# Patient Record
Sex: Male | Born: 1970 | Race: Black or African American | Hispanic: No | Marital: Married | Smoking: Current some day smoker
Health system: Southern US, Community
[De-identification: ages and names within clinical notes are randomized; demographics above are authoritative.]

---

## 1998-12-07 ENCOUNTER — Ambulatory Visit (HOSPITAL_COMMUNITY): Admission: RE | Admit: 1998-12-07 | Discharge: 1998-12-07 | Payer: Self-pay | Admitting: Internal Medicine

## 1998-12-07 ENCOUNTER — Encounter: Payer: Self-pay | Admitting: Internal Medicine

## 2000-10-02 ENCOUNTER — Emergency Department (HOSPITAL_COMMUNITY): Admission: EM | Admit: 2000-10-02 | Discharge: 2000-10-02 | Payer: Self-pay

## 2002-02-07 ENCOUNTER — Emergency Department (HOSPITAL_COMMUNITY): Admission: EM | Admit: 2002-02-07 | Discharge: 2002-02-07 | Payer: Self-pay

## 2002-06-10 ENCOUNTER — Encounter: Payer: Self-pay | Admitting: Emergency Medicine

## 2002-06-10 ENCOUNTER — Emergency Department (HOSPITAL_COMMUNITY): Admission: EM | Admit: 2002-06-10 | Discharge: 2002-06-10 | Payer: Self-pay

## 2002-10-30 ENCOUNTER — Encounter: Payer: Self-pay | Admitting: Orthopedic Surgery

## 2002-10-30 ENCOUNTER — Encounter: Admission: RE | Admit: 2002-10-30 | Discharge: 2002-10-30 | Payer: Self-pay | Admitting: Orthopedic Surgery

## 2002-11-13 ENCOUNTER — Encounter: Admission: RE | Admit: 2002-11-13 | Discharge: 2002-11-13 | Payer: Self-pay | Admitting: Orthopedic Surgery

## 2002-11-13 ENCOUNTER — Encounter: Payer: Self-pay | Admitting: Orthopedic Surgery

## 2003-12-28 ENCOUNTER — Emergency Department (HOSPITAL_COMMUNITY): Admission: EM | Admit: 2003-12-28 | Discharge: 2003-12-28 | Payer: Self-pay | Admitting: Emergency Medicine

## 2004-05-16 ENCOUNTER — Emergency Department (HOSPITAL_COMMUNITY): Admission: EM | Admit: 2004-05-16 | Discharge: 2004-05-16 | Payer: Self-pay | Admitting: Emergency Medicine
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
## 2004-07-19 ENCOUNTER — Encounter: Admission: RE | Admit: 2004-07-19 | Discharge: 2004-07-19 | Payer: Self-pay | Admitting: Neurosurgery

## 2004-08-04 ENCOUNTER — Encounter: Admission: RE | Admit: 2004-08-04 | Discharge: 2004-08-04 | Payer: Self-pay | Admitting: Neurosurgery

## 2004-08-23 ENCOUNTER — Ambulatory Visit (HOSPITAL_COMMUNITY): Admission: RE | Admit: 2004-08-23 | Discharge: 2004-08-25 | Payer: Self-pay | Admitting: Neurosurgery

## 2004-11-07 ENCOUNTER — Encounter: Admission: RE | Admit: 2004-11-07 | Discharge: 2005-02-01 | Payer: Self-pay | Admitting: Neurosurgery

## 2006-09-05 ENCOUNTER — Emergency Department (HOSPITAL_COMMUNITY): Admission: EM | Admit: 2006-09-05 | Discharge: 2006-09-05 | Payer: Self-pay | Admitting: Family Medicine

## 2007-08-09 ENCOUNTER — Ambulatory Visit (HOSPITAL_COMMUNITY): Admission: RE | Admit: 2007-08-09 | Discharge: 2007-08-09 | Payer: Self-pay | Admitting: Neurosurgery

## 2007-10-14 ENCOUNTER — Emergency Department (HOSPITAL_COMMUNITY): Admission: EM | Admit: 2007-10-14 | Discharge: 2007-10-14 | Payer: Self-pay | Admitting: *Deleted

## 2008-04-09 ENCOUNTER — Emergency Department (HOSPITAL_COMMUNITY): Admission: EM | Admit: 2008-04-09 | Discharge: 2008-04-09 | Payer: Self-pay | Admitting: Emergency Medicine

## 2011-05-11 NOTE — Op Note (Signed)
Bill Ball, Bill Ball                                ACCOUNT NO.:  [id number]   MEDICAL RECORD NO.:  [id number]                   PATIENT TYPE:  OIB   LOCATION:  3008                                 FACILITY:  [facility]   PHYSICIAN:  Coletta Memos, M.D.                  DATE OF BIRTH:  24-Jun-1971   DATE OF PROCEDURE:  DATE OF DISCHARGE:                                 OPERATIVE REPORT   DATE OF PROCEDURE:  August 23, 2004.   PREOPERATIVE DIAGNOSES:  1.  Lumbar stenosis, L4-5.  2.  Displaced disk, L4-5.  3.  Bilateral lumbar radiculopathy.   POSTOPERATIVE DIAGNOSES:  1.  Lumbar stenosis, L4-5.  2.  Displaced disk, L4-5.  3.  Bilateral lumbar radiculopathy.   PROCEDURE:  1.  Hemilaminectomy of L5.  2.  Bilateral diskectomies, L4-5, with microdissection.   COMPLICATIONS:  None.   SURGEON:  Coletta Memos, MD.   ASSISTANT:  Hewitt Shorts, MD.   INDICATIONS:  [name] is a 40 year old gentleman, who had severe stenosis  at L4-5 secondary to a displaced disk and ligamentous hypertrophy.  I  recommended operative decompression after conservative measures had failed.  He agreed and is taken to the operating room today for that.   OPERATIVE NOTE:  Mr. Rasmusson was brought to the operating room, intubated,  placed under general anesthetic, and rolled prone onto a Wilson frame.  All  pressure points were properly padded.  The skin was prepped, he was draped  in a sterile fashion.  I infiltrated 20 mL half-percent Lidocaine with  1:200,000 strength epinephrine.  Using a preoperative localizing film, I  opened the skin with a #10 blade and took that down to the thoracolumbar  fascia.  I then exposed the lamina of what I believed to be L4 and L5  bilaterally.  I took an intraoperative x-ray after placing a double-ended  ganglia knife inferior to the superior lamina in my exposure.  That turned  out to be the L4 lamina.  I then proceeded with hemilaminectomies of L5  bilaterally using a  high-speed drill and Kerrison punches.  I removed the  ligamentum flavum on both sides.  I exposed the disk space on both sides.  I  did most of the diskectomy from the right side using Dr. Earl Gala  assistance, microdissection, Kerrison punches, pituitary rongeurs, and  Epstein curettes.  I also opened the left-sided disk at L4-5 because, after  performing the diskectomy on the right, I still felt that there was a good  deal of pressure from the left side.  After decompressing the disk space, I  inspected the nerve roots.  I removed the ligamentum flavum so that there  was free passage of probes from the right to the left side  underneath the lamina, underneath the spinous process, and supraspinous  ligament.  The supraspinous ligament was kept intact.  I then irrigated the  wound copiously.  I then closed the wound in a layered fashion,  reapproximating the thoracolumbar fascia, subcutaneous tissues, and  subcuticular tissues.  Dermabond used for a sterile dressing.                                               Coletta Memos, M.D.    KC/MEDQ  D:  08/23/2004  T:  08/23/2004  Job:  [id number]

## 2011-05-28 ENCOUNTER — Inpatient Hospital Stay (INDEPENDENT_AMBULATORY_CARE_PROVIDER_SITE_OTHER)
Admission: RE | Admit: 2011-05-28 | Discharge: 2011-05-28 | Disposition: A | Discharge status: Home / Self Care | Payer: 59 | Source: Ambulatory Visit | Attending: Emergency Medicine | Admitting: Emergency Medicine

## 2013-01-05 ENCOUNTER — Other Ambulatory Visit: Payer: Self-pay | Admitting: Occupational Medicine

## 2013-01-05 ENCOUNTER — Ambulatory Visit: Payer: Self-pay

## 2013-01-05 DIAGNOSIS — M549 Dorsalgia, unspecified: Secondary | ICD-10-CM

## 2014-09-14 ENCOUNTER — Other Ambulatory Visit (HOSPITAL_COMMUNITY): Payer: Self-pay | Admitting: Neurosurgery

## 2014-09-14 DIAGNOSIS — R52 Pain, unspecified: Secondary | ICD-10-CM

## 2014-09-23 ENCOUNTER — Ambulatory Visit (HOSPITAL_COMMUNITY)
Admission: RE | Admit: 2014-09-23 | Discharge: 2014-09-23 | Disposition: A | Discharge status: Home / Self Care | Payer: 59 | Source: Ambulatory Visit | Attending: Neurosurgery | Admitting: Neurosurgery

## 2014-09-23 DIAGNOSIS — R52 Pain, unspecified: Secondary | ICD-10-CM

## 2014-09-23 DIAGNOSIS — M79604 Pain in right leg: Secondary | ICD-10-CM | POA: Insufficient documentation

## 2014-09-23 DIAGNOSIS — M545 Low back pain: Secondary | ICD-10-CM | POA: Diagnosis present

## 2014-09-23 MED ORDER — GADOBENATE DIMEGLUMINE 529 MG/ML IV SOLN
20.0000 mL | Freq: Once | INTRAVENOUS | Status: AC | PRN
  Administered 2014-09-23: 20 mL via INTRAVENOUS

## 2015-04-04 ENCOUNTER — Other Ambulatory Visit (HOSPITAL_COMMUNITY): Payer: Self-pay | Admitting: Orthopedic Surgery

## 2015-04-04 DIAGNOSIS — M25511 Pain in right shoulder: Secondary | ICD-10-CM

## 2015-04-22 ENCOUNTER — Ambulatory Visit (HOSPITAL_COMMUNITY)
Admission: RE | Admit: 2015-04-22 | Discharge: 2015-04-22 | Disposition: A | Discharge status: Home / Self Care | Payer: PRIVATE HEALTH INSURANCE | Source: Ambulatory Visit | Attending: Orthopedic Surgery | Admitting: Orthopedic Surgery

## 2015-04-22 DIAGNOSIS — X58XXXA Exposure to other specified factors, initial encounter: Secondary | ICD-10-CM | POA: Diagnosis not present

## 2015-04-22 DIAGNOSIS — M25511 Pain in right shoulder: Secondary | ICD-10-CM

## 2015-04-22 DIAGNOSIS — M19011 Primary osteoarthritis, right shoulder: Secondary | ICD-10-CM | POA: Diagnosis not present

## 2015-04-22 DIAGNOSIS — S46011A Strain of muscle(s) and tendon(s) of the rotator cuff of right shoulder, initial encounter: Secondary | ICD-10-CM | POA: Insufficient documentation

## 2015-05-08 ENCOUNTER — Emergency Department (HOSPITAL_COMMUNITY)
Admission: EM | Admit: 2015-05-08 | Discharge: 2015-05-08 | Disposition: A | Discharge status: Home / Self Care | Payer: 59 | Source: Home / Self Care | Attending: Emergency Medicine | Admitting: Emergency Medicine

## 2015-05-08 ENCOUNTER — Encounter (HOSPITAL_COMMUNITY): Payer: Self-pay | Admitting: Emergency Medicine

## 2015-05-08 DIAGNOSIS — L03114 Cellulitis of left upper limb: Secondary | ICD-10-CM | POA: Diagnosis not present

## 2015-05-08 DIAGNOSIS — W57XXXA Bitten or stung by nonvenomous insect and other nonvenomous arthropods, initial encounter: Secondary | ICD-10-CM | POA: Diagnosis not present

## 2015-05-08 DIAGNOSIS — T148 Other injury of unspecified body region: Secondary | ICD-10-CM

## 2015-05-08 MED ORDER — PENICILLIN G BENZATHINE & PROC 900000-300000 UNIT/2ML IM SUSP
1.2000 10*6.[IU] | Freq: Once | INTRAMUSCULAR | Status: AC
  Administered 2015-05-08: 1.2 10*6.[IU] via INTRAMUSCULAR

## 2015-05-08 MED ORDER — PENICILLIN G BENZATHINE 1200000 UNIT/2ML IM SUSP
INTRAMUSCULAR | Status: AC
Start: 2015-05-08 — End: 2015-05-08
  Filled 2015-05-08: qty 2

## 2015-05-08 MED ORDER — CLINDAMYCIN HCL 300 MG PO CAPS: 300.0000 mg | ORAL_CAPSULE | Freq: Three times a day (TID) | ORAL | Status: AC

## 2015-05-08 NOTE — ED Notes (Signed)
C/o insect bite on left arm which was noticed on Thursday States area does itch, is warm and spreaded States on Wednesday he was nausea and fatigue

## 2015-05-08 NOTE — Discharge Instructions (Signed)
Insect bite looks like it is getting infected. Take clindamycin 3 times a day for the next 10 days. You may have some loose stool on the antibiotic. You should see improvement in the next 2 days. If the redness is spreading very rapidly, please go to the emergency room. If things are not improving in 2 days, please come back.

## 2015-05-08 NOTE — ED Provider Notes (Signed)
CSN: [id number]     Arrival date & time 05/08/15  0904 History   First MD Initiated Contact with Patient 05/08/15 0932     Chief Complaint  Patient presents with  . Insect Bite   (Consider location/radiation/quality/duration/timing/severity/associated sxs/prior Treatment) HPI  He is a 44 year old man here for evaluation of this site. He first noticed the bite on Thursday. It is located on his left upper arm. It initially started out as a small bump, but over the last 2 days has developed spreading redness. He reports feeling weak and nauseous on Thursday. No fevers. He did not see what bit him. The site does itch. He has been using alcohol and Betadine without improvement.  History reviewed. No pertinent past medical history. No past surgical history on file. History reviewed. No pertinent family history. History  Substance Use Topics  . Smoking status: Not on file  . Smokeless tobacco: Not on file  . Alcohol Use: Not on file    Review of Systems As in history of present illness Allergies  Review of patient's allergies indicates no known allergies.  Home Medications   Prior to Admission medications   Medication Sig Start Date End Date Taking? Authorizing Provider  clindamycin (CLEOCIN) 300 MG capsule Take 1 capsule (300 mg total) by mouth 3 (three) times daily. 05/08/15   Charm RingsErin J Javana Schey, MD   BP 127/89 mmHg  Pulse 72  Temp(Src) 96.8 F (36 C) (Oral)  Resp 16  SpO2 96% Physical Exam  Constitutional: He is oriented to person, place, and time. He appears well-developed and well-nourished. No distress.  Cardiovascular: Normal rate.   Pulmonary/Chest: Effort normal.  Neurological: He is alert and oriented to person, place, and time.  Skin:  5x7 cm area of redness with a papule in the upper area. This is warm. No fluctuance or crepitus. Area outlined with surgical marker.    ED Course  Procedures (including critical care time) Labs Review Labs Reviewed - No data to  display  Imaging Review No results found.   MDM   1. Cellulitis of left upper extremity   2. Insect bite    Penicillin 1.2 million units IM given.  We'll discharge home with clindamycin for 10 days. Return precautions reviewed. Follow-up in 2 days if no improvement.    Charm RingsErin J Gerry Blanchfield, MD 05/08/15 [phone]

## 2016-01-02 DIAGNOSIS — E291 Testicular hypofunction: Secondary | ICD-10-CM | POA: Diagnosis not present

## 2016-01-16 DIAGNOSIS — E291 Testicular hypofunction: Secondary | ICD-10-CM | POA: Diagnosis not present

## 2016-01-30 DIAGNOSIS — E291 Testicular hypofunction: Secondary | ICD-10-CM | POA: Diagnosis not present

## 2016-02-13 DIAGNOSIS — E291 Testicular hypofunction: Secondary | ICD-10-CM | POA: Diagnosis not present

## 2016-02-27 DIAGNOSIS — E291 Testicular hypofunction: Secondary | ICD-10-CM | POA: Diagnosis not present

## 2016-03-06 MED FILL — valACYclovir HCL 1 GM TABS: 1 | 90 days supply | Qty: 90 | Fill #0

## 2016-03-09 DIAGNOSIS — J3089 Other allergic rhinitis: Secondary | ICD-10-CM | POA: Diagnosis not present

## 2016-03-09 DIAGNOSIS — J301 Allergic rhinitis due to pollen: Secondary | ICD-10-CM | POA: Diagnosis not present

## 2016-03-09 DIAGNOSIS — J309 Allergic rhinitis, unspecified: Secondary | ICD-10-CM | POA: Diagnosis not present

## 2016-03-09 DIAGNOSIS — L509 Urticaria, unspecified: Secondary | ICD-10-CM | POA: Diagnosis not present

## 2016-03-11 MED FILL — DEPO-TESTOSTERONE 200 MG/ML: 200 | 84 days supply | Qty: 6 | Fill #1

## 2016-03-12 DIAGNOSIS — E291 Testicular hypofunction: Secondary | ICD-10-CM | POA: Diagnosis not present

## 2016-03-21 MED FILL — LEVOCETIRIZINE 5 MG TABLET: 5 | 30 days supply | Qty: 30 | Fill #0 | Status: TO

## 2016-03-21 MED FILL — MOMETASONE FUROATE 0.1% CRM: 0.1 | 30 days supply | Qty: 90 | Fill #0

## 2016-03-21 MED FILL — FLUTICASONE PROP 50 MCG SPR: 50 | 30 days supply | Qty: 16 | Fill #0

## 2016-03-26 DIAGNOSIS — E291 Testicular hypofunction: Secondary | ICD-10-CM | POA: Diagnosis not present

## 2016-04-09 DIAGNOSIS — E291 Testicular hypofunction: Secondary | ICD-10-CM | POA: Diagnosis not present

## 2016-04-23 DIAGNOSIS — E291 Testicular hypofunction: Secondary | ICD-10-CM | POA: Diagnosis not present

## 2016-05-01 MED FILL — LEVOCETIRIZINE 5 MG TABLET: 5 | 30 days supply | Qty: 30 | Fill #0 | Status: TO

## 2016-05-07 DIAGNOSIS — E291 Testicular hypofunction: Secondary | ICD-10-CM | POA: Diagnosis not present

## 2016-05-22 DIAGNOSIS — E291 Testicular hypofunction: Secondary | ICD-10-CM | POA: Diagnosis not present

## 2016-05-31 MED FILL — valACYclovir HCL 1 GM TABS: 1 | 90 days supply | Qty: 90 | Fill #0

## 2016-06-01 MED FILL — TESTOSTERONE CYP 200 MG/ML: 200 | 14 days supply | Qty: 1 | Fill #0

## 2016-06-04 DIAGNOSIS — E291 Testicular hypofunction: Secondary | ICD-10-CM | POA: Diagnosis not present

## 2016-06-07 DIAGNOSIS — H40013 Open angle with borderline findings, low risk, bilateral: Secondary | ICD-10-CM | POA: Diagnosis not present

## 2016-06-11 MED FILL — LEVOCETIRIZINE 5 MG TABLET: 5 | 30 days supply | Qty: 30 | Fill #0

## 2016-06-14 MED FILL — DEPO-TESTOSTERONE 200 MG/ML: 200 | 84 days supply | Qty: 6 | Fill #1

## 2016-06-18 DIAGNOSIS — A6000 Herpesviral infection of urogenital system, unspecified: Secondary | ICD-10-CM | POA: Diagnosis not present

## 2016-06-18 DIAGNOSIS — Z0000 Encounter for general adult medical examination without abnormal findings: Secondary | ICD-10-CM | POA: Diagnosis not present

## 2016-06-18 DIAGNOSIS — Z13220 Encounter for screening for lipoid disorders: Secondary | ICD-10-CM | POA: Diagnosis not present

## 2016-06-18 DIAGNOSIS — E291 Testicular hypofunction: Secondary | ICD-10-CM | POA: Diagnosis not present

## 2016-06-18 DIAGNOSIS — Z79899 Other long term (current) drug therapy: Secondary | ICD-10-CM | POA: Diagnosis not present

## 2016-07-02 DIAGNOSIS — E291 Testicular hypofunction: Secondary | ICD-10-CM | POA: Diagnosis not present

## 2016-07-03 ENCOUNTER — Telehealth: Payer: Self-pay

## 2016-07-03 NOTE — Telephone Encounter (Signed)
Patient needs FMLA forms completed to take care of his father Bill Ball, who is a patient of Dr Ellis Parentsaub's I have highlighted the areas that need to be completed, I was going to try and do them but the OV notes have not been completed. So I will place these forms in your box on 07/03/16 please complete and return to the FMLA/Disability box at the [employer] checkout desk within 5-7 business days. Thank you!

## 2016-07-04 NOTE — Telephone Encounter (Signed)
Paperwork scanned and faxed to [employer] on 07/04/16

## 2016-07-16 DIAGNOSIS — E291 Testicular hypofunction: Secondary | ICD-10-CM | POA: Diagnosis not present

## 2016-07-31 DIAGNOSIS — E291 Testicular hypofunction: Secondary | ICD-10-CM | POA: Diagnosis not present

## 2016-08-06 MED FILL — LEVOCETIRIZINE 5 MG TABLET: 5 | 30 days supply | Qty: 30 | Fill #1

## 2016-08-07 MED FILL — DEPO-TESTOSTERONE 200 MG/ML: 200 | 84 days supply | Qty: 10 | Fill #0

## 2016-08-13 DIAGNOSIS — E291 Testicular hypofunction: Secondary | ICD-10-CM | POA: Diagnosis not present

## 2016-08-28 DIAGNOSIS — E291 Testicular hypofunction: Secondary | ICD-10-CM | POA: Diagnosis not present

## 2016-09-10 DIAGNOSIS — E291 Testicular hypofunction: Secondary | ICD-10-CM | POA: Diagnosis not present

## 2016-09-25 MED FILL — LEVOCETIRIZINE 5 MG TABLET: 5 | 30 days supply | Qty: 30 | Fill #0

## 2016-09-26 DIAGNOSIS — E291 Testicular hypofunction: Secondary | ICD-10-CM | POA: Diagnosis not present

## 2016-10-09 DIAGNOSIS — E291 Testicular hypofunction: Secondary | ICD-10-CM | POA: Diagnosis not present

## 2016-10-22 DIAGNOSIS — E291 Testicular hypofunction: Secondary | ICD-10-CM | POA: Diagnosis not present

## 2016-10-29 MED FILL — DEPO-TESTOSTERONE 200 MG/ML: 200 | 84 days supply | Qty: 9 | Fill #0

## 2016-11-01 MED FILL — LEVOCETIRIZINE 5 MG TABLET: 5 | 30 days supply | Qty: 30 | Fill #1

## 2016-11-05 DIAGNOSIS — E291 Testicular hypofunction: Secondary | ICD-10-CM | POA: Diagnosis not present

## 2016-11-19 DIAGNOSIS — E291 Testicular hypofunction: Secondary | ICD-10-CM | POA: Diagnosis not present

## 2016-12-04 DIAGNOSIS — E291 Testicular hypofunction: Secondary | ICD-10-CM | POA: Diagnosis not present

## 2016-12-14 MED FILL — LEVOCETIRIZINE 5 MG TABLET: 5 | 30 days supply | Qty: 30 | Fill #2

## 2016-12-18 DIAGNOSIS — E291 Testicular hypofunction: Secondary | ICD-10-CM | POA: Diagnosis not present

## 2016-12-31 DIAGNOSIS — E291 Testicular hypofunction: Secondary | ICD-10-CM | POA: Diagnosis not present

## 2017-01-16 DIAGNOSIS — E291 Testicular hypofunction: Secondary | ICD-10-CM | POA: Diagnosis not present

## 2017-01-24 MED FILL — DEPO-TESTOSTERONE 200 MG/ML: 200 | 84 days supply | Qty: 9 | Fill #1

## 2017-01-28 DIAGNOSIS — E291 Testicular hypofunction: Secondary | ICD-10-CM | POA: Diagnosis not present

## 2017-02-11 DIAGNOSIS — E291 Testicular hypofunction: Secondary | ICD-10-CM | POA: Diagnosis not present

## 2017-02-18 MED FILL — LEVOCETIRIZINE 5 MG TABLET: 5 | 30 days supply | Qty: 30 | Fill #0

## 2017-02-25 DIAGNOSIS — E291 Testicular hypofunction: Secondary | ICD-10-CM | POA: Diagnosis not present

## 2017-03-12 DIAGNOSIS — E291 Testicular hypofunction: Secondary | ICD-10-CM | POA: Diagnosis not present

## 2017-03-26 DIAGNOSIS — E291 Testicular hypofunction: Secondary | ICD-10-CM | POA: Diagnosis not present

## 2017-04-24 MED FILL — TESTOSTERONE CYP 200 MG/ML: 200 | 84 days supply | Qty: 9 | Fill #0

## 2017-04-29 DIAGNOSIS — E291 Testicular hypofunction: Secondary | ICD-10-CM | POA: Diagnosis not present

## 2017-05-13 DIAGNOSIS — E291 Testicular hypofunction: Secondary | ICD-10-CM | POA: Diagnosis not present

## 2017-05-28 DIAGNOSIS — E291 Testicular hypofunction: Secondary | ICD-10-CM | POA: Diagnosis not present

## 2017-06-11 DIAGNOSIS — E291 Testicular hypofunction: Secondary | ICD-10-CM | POA: Diagnosis not present

## 2017-06-24 DIAGNOSIS — E291 Testicular hypofunction: Secondary | ICD-10-CM | POA: Diagnosis not present

## 2017-07-01 DIAGNOSIS — E291 Testicular hypofunction: Secondary | ICD-10-CM | POA: Diagnosis not present

## 2017-07-01 DIAGNOSIS — A6000 Herpesviral infection of urogenital system, unspecified: Secondary | ICD-10-CM | POA: Diagnosis not present

## 2017-07-01 DIAGNOSIS — Z79899 Other long term (current) drug therapy: Secondary | ICD-10-CM | POA: Diagnosis not present

## 2017-07-01 DIAGNOSIS — Z720 Tobacco use: Secondary | ICD-10-CM | POA: Diagnosis not present

## 2017-07-01 DIAGNOSIS — Z0000 Encounter for general adult medical examination without abnormal findings: Secondary | ICD-10-CM | POA: Diagnosis not present

## 2017-07-08 DIAGNOSIS — E291 Testicular hypofunction: Secondary | ICD-10-CM | POA: Diagnosis not present

## 2017-07-10 MED FILL — TESTOSTERONE CYP 200 MG/ML: 200 | 84 days supply | Qty: 9 | Fill #0

## 2017-07-22 DIAGNOSIS — E291 Testicular hypofunction: Secondary | ICD-10-CM | POA: Diagnosis not present

## 2017-08-05 DIAGNOSIS — E291 Testicular hypofunction: Secondary | ICD-10-CM | POA: Diagnosis not present

## 2017-08-19 DIAGNOSIS — E291 Testicular hypofunction: Secondary | ICD-10-CM | POA: Diagnosis not present

## 2017-08-23 DIAGNOSIS — L509 Urticaria, unspecified: Secondary | ICD-10-CM | POA: Diagnosis not present

## 2017-08-23 DIAGNOSIS — J301 Allergic rhinitis due to pollen: Secondary | ICD-10-CM | POA: Diagnosis not present

## 2017-08-23 DIAGNOSIS — F17200 Nicotine dependence, unspecified, uncomplicated: Secondary | ICD-10-CM | POA: Diagnosis not present

## 2017-08-23 DIAGNOSIS — J3089 Other allergic rhinitis: Secondary | ICD-10-CM | POA: Diagnosis not present

## 2017-08-23 MED FILL — LEVOCETIRIZINE 5 MG TABLET: 5 | 30 days supply | Qty: 30 | Fill #0

## 2017-09-02 DIAGNOSIS — E291 Testicular hypofunction: Secondary | ICD-10-CM | POA: Diagnosis not present

## 2017-09-16 DIAGNOSIS — E291 Testicular hypofunction: Secondary | ICD-10-CM | POA: Diagnosis not present

## 2017-09-30 DIAGNOSIS — E291 Testicular hypofunction: Secondary | ICD-10-CM | POA: Diagnosis not present

## 2017-09-30 MED FILL — TESTOSTERONE CYP 200 MG/ML: 200 | 56 days supply | Qty: 9 | Fill #0

## 2017-10-14 DIAGNOSIS — E291 Testicular hypofunction: Secondary | ICD-10-CM | POA: Diagnosis not present

## 2017-10-28 DIAGNOSIS — E291 Testicular hypofunction: Secondary | ICD-10-CM | POA: Diagnosis not present

## 2017-11-11 DIAGNOSIS — E291 Testicular hypofunction: Secondary | ICD-10-CM | POA: Diagnosis not present

## 2017-11-25 DIAGNOSIS — E291 Testicular hypofunction: Secondary | ICD-10-CM | POA: Diagnosis not present

## 2017-12-09 DIAGNOSIS — E291 Testicular hypofunction: Secondary | ICD-10-CM | POA: Diagnosis not present

## 2017-12-11 DIAGNOSIS — E291 Testicular hypofunction: Secondary | ICD-10-CM | POA: Diagnosis not present

## 2017-12-11 DIAGNOSIS — N5201 Erectile dysfunction due to arterial insufficiency: Secondary | ICD-10-CM | POA: Diagnosis not present

## 2017-12-11 MED FILL — SILDENAFIL CITRATE 100 MG T: 100 | 30 days supply | Qty: 6 | Fill #0 | Status: TO

## 2017-12-12 MED FILL — TESTOSTERONE CYP 200 MG/ML: 200 | 56 days supply | Qty: 8 | Fill #0

## 2017-12-23 DIAGNOSIS — E291 Testicular hypofunction: Secondary | ICD-10-CM | POA: Diagnosis not present

## 2018-01-04 MED FILL — SILDENAFIL CITRATE 100 MG T: 100 | 30 days supply | Qty: 6 | Fill #0

## 2018-01-06 DIAGNOSIS — E291 Testicular hypofunction: Secondary | ICD-10-CM | POA: Diagnosis not present

## 2018-01-20 DIAGNOSIS — E291 Testicular hypofunction: Secondary | ICD-10-CM | POA: Diagnosis not present

## 2018-02-03 DIAGNOSIS — E291 Testicular hypofunction: Secondary | ICD-10-CM | POA: Diagnosis not present

## 2018-02-17 DIAGNOSIS — E291 Testicular hypofunction: Secondary | ICD-10-CM | POA: Diagnosis not present

## 2018-03-03 DIAGNOSIS — E291 Testicular hypofunction: Secondary | ICD-10-CM | POA: Diagnosis not present

## 2018-03-04 MED FILL — TESTOSTERONE CYP 200 MG/ML: 200 | 56 days supply | Qty: 8 | Fill #0

## 2018-03-07 MED FILL — valACYclovir HCL 1 GM TABS: 1 | 90 days supply | Qty: 90 | Fill #0

## 2018-03-17 DIAGNOSIS — E291 Testicular hypofunction: Secondary | ICD-10-CM | POA: Diagnosis not present

## 2018-03-28 MED FILL — SILDENAFIL CITRATE 100 MG T: 100 | 30 days supply | Qty: 6 | Fill #1

## 2018-03-31 DIAGNOSIS — E291 Testicular hypofunction: Secondary | ICD-10-CM | POA: Diagnosis not present

## 2018-04-14 DIAGNOSIS — E291 Testicular hypofunction: Secondary | ICD-10-CM | POA: Diagnosis not present

## 2018-04-28 DIAGNOSIS — E291 Testicular hypofunction: Secondary | ICD-10-CM | POA: Diagnosis not present

## 2018-05-12 DIAGNOSIS — E291 Testicular hypofunction: Secondary | ICD-10-CM | POA: Diagnosis not present

## 2018-05-26 DIAGNOSIS — E291 Testicular hypofunction: Secondary | ICD-10-CM | POA: Diagnosis not present

## 2018-05-26 MED FILL — DEPO-TESTOSTERONE 200 MG/ML: 200 | 84 days supply | Qty: 9 | Fill #0

## 2018-05-26 MED FILL — SILDENAFIL CITRATE 100 MG T: 100 | 30 days supply | Qty: 6 | Fill #2

## 2018-06-09 DIAGNOSIS — E291 Testicular hypofunction: Secondary | ICD-10-CM | POA: Diagnosis not present

## 2018-06-23 DIAGNOSIS — E291 Testicular hypofunction: Secondary | ICD-10-CM | POA: Diagnosis not present

## 2018-07-16 DIAGNOSIS — Z13220 Encounter for screening for lipoid disorders: Secondary | ICD-10-CM | POA: Diagnosis not present

## 2018-07-16 DIAGNOSIS — A6000 Herpesviral infection of urogenital system, unspecified: Secondary | ICD-10-CM | POA: Diagnosis not present

## 2018-07-16 DIAGNOSIS — Z720 Tobacco use: Secondary | ICD-10-CM | POA: Diagnosis not present

## 2018-07-16 DIAGNOSIS — Z0000 Encounter for general adult medical examination without abnormal findings: Secondary | ICD-10-CM | POA: Diagnosis not present

## 2018-07-16 DIAGNOSIS — Z23 Encounter for immunization: Secondary | ICD-10-CM | POA: Diagnosis not present

## 2018-07-16 DIAGNOSIS — N5201 Erectile dysfunction due to arterial insufficiency: Secondary | ICD-10-CM | POA: Diagnosis not present

## 2018-07-16 DIAGNOSIS — Z79899 Other long term (current) drug therapy: Secondary | ICD-10-CM | POA: Diagnosis not present

## 2018-07-16 DIAGNOSIS — R030 Elevated blood-pressure reading, without diagnosis of hypertension: Secondary | ICD-10-CM | POA: Diagnosis not present

## 2018-07-16 DIAGNOSIS — E291 Testicular hypofunction: Secondary | ICD-10-CM | POA: Diagnosis not present

## 2018-07-22 MED FILL — valACYclovir HCL 1 GM TABS: 1 | 90 days supply | Qty: 90 | Fill #0

## 2018-07-22 MED FILL — CHANTIX STARTING MONTH BOX: 0.5 MG X 11 | 28 days supply | Qty: 53 | Fill #0

## 2018-07-22 MED FILL — SILDENAFIL CITRATE 100 MG T: 100 | 30 days supply | Qty: 6 | Fill #0

## 2018-09-02 MED FILL — SILDENAFIL CITRATE 100 MG T: 100 | 30 days supply | Qty: 6 | Fill #3

## 2018-09-02 MED FILL — TESTOSTERONE CYP 200 MG/ML: 200 | 56 days supply | Qty: 8 | Fill #0

## 2018-11-06 MED FILL — SILDENAFIL CITRATE 100 MG T: 100 | 30 days supply | Qty: 6 | Fill #4

## 2018-11-06 MED FILL — TESTOSTERONE CYP 200 MG/ML: 200 | 56 days supply | Qty: 8 | Fill #1

## 2018-11-25 MED FILL — SILDENAFIL CITRATE 100 MG T: 100 | 30 days supply | Qty: 6 | Fill #5

## 2018-11-30 ENCOUNTER — Ambulatory Visit (HOSPITAL_COMMUNITY)
Admission: EM | Admit: 2018-11-30 | Discharge: 2018-11-30 | Disposition: A | Discharge status: Home / Self Care | Payer: 59 | Attending: Physician Assistant | Admitting: Physician Assistant

## 2018-11-30 ENCOUNTER — Encounter (HOSPITAL_COMMUNITY): Payer: Self-pay | Admitting: Emergency Medicine

## 2018-11-30 DIAGNOSIS — H1032 Unspecified acute conjunctivitis, left eye: Secondary | ICD-10-CM

## 2018-11-30 DIAGNOSIS — H02844 Edema of left upper eyelid: Secondary | ICD-10-CM

## 2018-11-30 MED ORDER — OLOPATADINE HCL 0.2 % OP SOLN: 1.0000 [drp] | Freq: Every day | OPHTHALMIC | 0 refills | Status: AC

## 2018-11-30 MED ORDER — PREDNISONE 50 MG PO TABS: 50.0000 mg | ORAL_TABLET | Freq: Every day | ORAL | 0 refills | Status: AC

## 2018-11-30 NOTE — Discharge Instructions (Signed)
Prednisone for eyelid swelling. Use pataday eyedrops as directed on left eye. Lid scrubs and warm compresses as directed. Monitor for any worsening of symptoms, changes in vision, sensitivity to light, eye swelling, painful eye movement, follow up with ophthalmology for further evaluation.

## 2018-11-30 NOTE — ED Triage Notes (Signed)
Pt c/o L eye swelling x2 days.

## 2018-11-30 NOTE — ED Provider Notes (Signed)
[facility]    CSN: [id number] Arrival date & time: 11/30/18  1700     History   Chief Complaint Chief Complaint  Patient presents with  . Eye Problem    HPI Bill Ball is a 47 y.o. male.   47 year old male comes in for 2 day history of left eyelid swelling that is worse today. Swelling to the lower and upper lid with mild erythema. Denies warmth. Some irritation to the eyelid. Eye redness with tearing. No crusting in the morning. Did mulch work 2 days ago. No other exposures. Denies scratching/rubbing. Used otc eye drops without relief. Wears glasses, no contact lens use.      History reviewed. No pertinent past medical history.  There are no active problems to display for this patient.   History reviewed. No pertinent surgical history.     Home Medications    Prior to Admission medications   Medication Sig Start Date End Date Taking? Authorizing Provider  clindamycin (CLEOCIN) 300 MG capsule Take 1 capsule (300 mg total) by mouth 3 (three) times daily. Patient not taking: Reported on 11/30/2018 05/08/15   Charm Rings, MD  Olopatadine HCl 0.2 % SOLN Apply 1 drop to eye daily. 11/30/18   Cathie Hoops, Anna Livers V, PA-C  predniSONE (DELTASONE) 50 MG tablet Take 1 tablet (50 mg total) by mouth daily. 11/30/18   Belinda Fisher, PA-C    Family History No family history on file.  Social History Social History   Tobacco Use  . Smoking status: Current Some Day Smoker  Substance Use Topics  . Alcohol use: Yes  . Drug use: Never     Allergies   Patient has no known allergies.   Review of Systems Review of Systems  Reason unable to perform ROS: See HPI as above.     Physical Exam Triage Vital Signs ED Triage Vitals  Enc Vitals Group     BP 11/30/18 1752 (!) 138/92     Pulse Rate 11/30/18 1752 74     Resp 11/30/18 1752 16     Temp 11/30/18 1752 98.4 F (36.9 C)     Temp Source 11/30/18 1752 Oral     SpO2 11/30/18 1752 99 %     Weight --      Height --    Head Circumference --      Peak Flow --      Pain Score 11/30/18 1755 0     Pain Loc --      Pain Edu? --      Excl. in GC? --    No data found.  Updated Vital Signs BP (!) 138/92 (BP Location: Right Arm)   Pulse 74   Temp 98.4 F (36.9 C) (Oral)   Resp 16   SpO2 99%   Visual Acuity Right Eye Distance: 20/20 Left Eye Distance: 20/25 Bilateral Distance: 20/20  Right Eye Near:   Left Eye Near:    Bilateral Near:     Physical Exam  Constitutional: He is oriented to person, place, and time. He appears well-developed and well-nourished. No distress.  HENT:  Head: Normocephalic and atraumatic.  Right Ear: Tympanic membrane, external ear and ear canal normal. Tympanic membrane is not erythematous and not bulging.  Left Ear: Tympanic membrane, external ear and ear canal normal. Tympanic membrane is not erythematous and not bulging.  Nose: Nose normal. No mucosal edema or rhinorrhea. Right sinus exhibits no maxillary sinus tenderness and no frontal sinus tenderness.  Left sinus exhibits no maxillary sinus tenderness and no frontal sinus tenderness.  Eyes: Pupils are equal, round, and reactive to light. EOM are normal. Left conjunctiva is injected.  Eyelid swelling to the left upper and lower lid with mild erythema. No warmth. No tenderness to palpation.   Neck: Normal range of motion. Neck supple.  Neurological: He is alert and oriented to person, place, and time.  Skin: He is not diaphoretic.     UC Treatments / Results  Labs (all labs ordered are listed, but only abnormal results are displayed) Labs Reviewed - No data to display  EKG None  Radiology No results found.  Procedures Procedures (including critical care time)  Medications Ordered in UC Medications - No data to display  Initial Impression / Assessment and Plan / UC Course  I have reviewed the triage vital signs and the nursing notes.  Pertinent labs & imaging results that were available during my care of  the patient were reviewed by me and considered in my medical decision making (see chart for details).    Discussed possible allergies/irritation causing symptoms. Will provide prednisone as directed. pataday for allergic conjunctivitis. Other symptomatic treatment discussed. Return precautions given.  Final Clinical Impressions(s) / UC Diagnoses   Final diagnoses:  Swelling of left upper eyelid  Acute conjunctivitis of left eye, unspecified acute conjunctivitis type    ED Prescriptions    Medication Sig Dispense Auth. Provider   predniSONE (DELTASONE) 50 MG tablet Take 1 tablet (50 mg total) by mouth daily. 5 tablet Lailanie Hasley V, PA-C   Olopatadine HCl 0.2 % SOLN Apply 1 drop to eye daily. 2.5 mL Threasa AlphaYu, Raeden Schippers V, PA-C        Marylynne Keelin V, [place]PA-C 11/30/18 1925

## 2018-12-01 DIAGNOSIS — H1045 Other chronic allergic conjunctivitis: Secondary | ICD-10-CM | POA: Diagnosis not present

## 2018-12-01 MED FILL — PREDNISOLONE AC 1% EYE DROP: 1 | 20 days supply | Qty: 5 | Fill #0

## 2018-12-08 DIAGNOSIS — H11153 Pinguecula, bilateral: Secondary | ICD-10-CM | POA: Diagnosis not present

## 2018-12-30 MED FILL — TESTOSTERONE CYP 200 MG/ML: 200 | 13 days supply | Qty: 2 | Fill #2

## 2019-01-16 DIAGNOSIS — Z5181 Encounter for therapeutic drug level monitoring: Secondary | ICD-10-CM | POA: Diagnosis not present

## 2019-01-16 DIAGNOSIS — Z79899 Other long term (current) drug therapy: Secondary | ICD-10-CM | POA: Diagnosis not present

## 2019-01-16 DIAGNOSIS — E291 Testicular hypofunction: Secondary | ICD-10-CM | POA: Diagnosis not present

## 2019-01-27 MED FILL — TESTOSTERONE CYP 200 MG/ML: 200 | 70 days supply | Qty: 10 | Fill #0 | Status: TO

## 2019-02-09 DIAGNOSIS — H40023 Open angle with borderline findings, high risk, bilateral: Secondary | ICD-10-CM | POA: Diagnosis not present

## 2019-02-16 MED FILL — SILDENAFIL CITRATE 100 MG T: 100 | 30 days supply | Qty: 6 | Fill #1

## 2019-02-24 DIAGNOSIS — H40023 Open angle with borderline findings, high risk, bilateral: Secondary | ICD-10-CM | POA: Diagnosis not present

## 2019-04-10 MED FILL — TESTOSTERONE CYP 200 MG/ML: 200 | 56 days supply | Qty: 8 | Fill #0

## 2019-04-10 MED FILL — SILDENAFIL CITRATE 100 MG T: 100 | 30 days supply | Qty: 6 | Fill #2

## 2019-04-10 MED FILL — valACYclovir HCL 1 GM TABS: 1 | 90 days supply | Qty: 90 | Fill #1

## 2019-04-29 DIAGNOSIS — D2239 Melanocytic nevi of other parts of face: Secondary | ICD-10-CM | POA: Diagnosis not present

## 2019-04-29 DIAGNOSIS — L821 Other seborrheic keratosis: Secondary | ICD-10-CM | POA: Diagnosis not present

## 2019-04-29 DIAGNOSIS — L820 Inflamed seborrheic keratosis: Secondary | ICD-10-CM | POA: Diagnosis not present

## 2019-06-03 MED FILL — SILDENAFIL CITRATE 100 MG T: 100 | 30 days supply | Qty: 4 | Fill #3

## 2019-06-17 MED FILL — TESTOSTERONE CYPIONATE 200: 200 | 56 days supply | Qty: 8 | Fill #1

## 2019-07-05 DIAGNOSIS — Z20828 Contact with and (suspected) exposure to other viral communicable diseases: Secondary | ICD-10-CM | POA: Diagnosis not present

## 2019-07-07 MED FILL — PEG-3350 SOLUTION: 420 | 4000 days supply | Qty: 4000 | Fill #0

## 2019-07-07 MED FILL — SM GENTLE LAXATIVE EC 5 MG: 5 MG | 1 days supply | Qty: 4 | Fill #0

## 2019-07-14 DIAGNOSIS — Z1211 Encounter for screening for malignant neoplasm of colon: Secondary | ICD-10-CM | POA: Diagnosis not present

## 2019-07-14 DIAGNOSIS — K5730 Diverticulosis of large intestine without perforation or abscess without bleeding: Secondary | ICD-10-CM | POA: Diagnosis not present

## 2019-07-14 DIAGNOSIS — D120 Benign neoplasm of cecum: Secondary | ICD-10-CM | POA: Diagnosis not present

## 2019-08-06 DIAGNOSIS — E291 Testicular hypofunction: Secondary | ICD-10-CM | POA: Diagnosis not present

## 2019-08-06 DIAGNOSIS — Z0000 Encounter for general adult medical examination without abnormal findings: Secondary | ICD-10-CM | POA: Diagnosis not present

## 2019-08-06 DIAGNOSIS — Z13220 Encounter for screening for lipoid disorders: Secondary | ICD-10-CM | POA: Diagnosis not present

## 2019-08-06 DIAGNOSIS — Z5181 Encounter for therapeutic drug level monitoring: Secondary | ICD-10-CM | POA: Diagnosis not present

## 2019-08-06 DIAGNOSIS — Z125 Encounter for screening for malignant neoplasm of prostate: Secondary | ICD-10-CM | POA: Diagnosis not present

## 2019-08-06 MED FILL — valACYclovir HCL 1 GM TABS: 1 | 90 days supply | Qty: 90 | Fill #0

## 2019-08-06 MED FILL — SILDENAFIL CITRATE 100 MG T: 100 | 30 days supply | Qty: 4 | Fill #0

## 2019-08-14 MED FILL — TESTOSTERONE CYP 200 MG/ML: 200 | 28 days supply | Qty: 4 | Fill #0

## 2019-09-02 MED FILL — CHANTIX STARTING MONTH BOX: 0.5 MG X 11 | 28 days supply | Qty: 53 | Fill #0

## 2019-09-04 DIAGNOSIS — Z23 Encounter for immunization: Secondary | ICD-10-CM | POA: Diagnosis not present

## 2019-09-07 DIAGNOSIS — Z23 Encounter for immunization: Secondary | ICD-10-CM | POA: Diagnosis not present

## 2019-09-10 MED FILL — TESTOSTERONE CYP 200 MG/ML: 200 | 56 days supply | Qty: 8 | Fill #1

## 2019-10-09 MED FILL — SILDENAFIL CITRATE 100 MG T: 100 | 30 days supply | Qty: 4 | Fill #1

## 2019-10-20 DIAGNOSIS — E291 Testicular hypofunction: Secondary | ICD-10-CM | POA: Diagnosis not present

## 2019-10-23 MED FILL — TESTOSTERONE CYP 200 MG/ML: 200 | 28 days supply | Qty: 4 | Fill #0

## 2019-11-06 DIAGNOSIS — R0683 Snoring: Secondary | ICD-10-CM | POA: Diagnosis not present

## 2019-11-09 DIAGNOSIS — Z23 Encounter for immunization: Secondary | ICD-10-CM | POA: Diagnosis not present

## 2019-12-04 MED FILL — CHANTIX 1 MG CONT MONTH BOX: 1 | 28 days supply | Qty: 56 | Fill #0

## 2019-12-04 MED FILL — TESTOSTERONE CYP 200 MG/ML: 200 | 28 days supply | Qty: 4 | Fill #1

## 2020-01-06 MED FILL — TESTOSTERONE CYP 200 MG/ML: 200 | 28 days supply | Qty: 4 | Fill #2

## 2020-01-17 MED FILL — CHANTIX 1 MG CONT MONTH BOX: 1 | 28 days supply | Qty: 56 | Fill #1

## 2020-02-01 MED FILL — TESTOSTERONE CYP 200 MG/ML: 200 | 28 days supply | Qty: 4 | Fill #3

## 2020-02-27 MED FILL — TESTOSTERONE CYP 200 MG/ML: 200 | 28 days supply | Qty: 4 | Fill #4

## 2020-03-29 DIAGNOSIS — E291 Testicular hypofunction: Secondary | ICD-10-CM | POA: Diagnosis not present

## 2020-03-29 MED FILL — valACYclovir HCL 1 GM TABS: 1 | 90 days supply | Qty: 90 | Fill #1

## 2020-03-29 MED FILL — TESTOSTERONE CYP 200 MG/ML: 200 | 28 days supply | Qty: 4 | Fill #5

## 2020-04-05 DIAGNOSIS — N5201 Erectile dysfunction due to arterial insufficiency: Secondary | ICD-10-CM | POA: Diagnosis not present

## 2020-04-05 DIAGNOSIS — E291 Testicular hypofunction: Secondary | ICD-10-CM | POA: Diagnosis not present

## 2020-04-22 MED FILL — TESTOSTERONE CYP 200 MG/ML: 200 | 28 days supply | Qty: 4 | Fill #0

## 2020-05-27 MED FILL — TESTOSTERONE CYP 200 MG/ML: 200 | 28 days supply | Qty: 4 | Fill #1

## 2020-06-23 MED FILL — TESTOSTERONE CYP 200 MG/ML: 200 | 28 days supply | Qty: 4 | Fill #2

## 2020-07-22 MED FILL — TESTOSTERONE CYP 200 MG/ML: 200 | 28 days supply | Qty: 4 | Fill #3

## 2020-08-11 DIAGNOSIS — Z0000 Encounter for general adult medical examination without abnormal findings: Secondary | ICD-10-CM | POA: Diagnosis not present

## 2020-08-11 DIAGNOSIS — R030 Elevated blood-pressure reading, without diagnosis of hypertension: Secondary | ICD-10-CM | POA: Diagnosis not present

## 2020-08-11 DIAGNOSIS — Z13220 Encounter for screening for lipoid disorders: Secondary | ICD-10-CM | POA: Diagnosis not present

## 2020-08-25 MED FILL — TESTOSTERONE CYP 200 MG/ML: 200 | 28 days supply | Qty: 4 | Fill #4

## 2020-09-19 MED FILL — TESTOSTERONE CYP 200 MG/ML: 200 | 28 days supply | Qty: 4 | Fill #5

## 2020-10-03 DIAGNOSIS — E291 Testicular hypofunction: Secondary | ICD-10-CM | POA: Diagnosis not present

## 2020-10-10 DIAGNOSIS — N5201 Erectile dysfunction due to arterial insufficiency: Secondary | ICD-10-CM | POA: Diagnosis not present

## 2020-10-10 DIAGNOSIS — E291 Testicular hypofunction: Secondary | ICD-10-CM | POA: Diagnosis not present

## 2020-10-13 DIAGNOSIS — Z23 Encounter for immunization: Secondary | ICD-10-CM | POA: Diagnosis not present

## 2020-10-24 ENCOUNTER — Other Ambulatory Visit (HOSPITAL_COMMUNITY): Payer: Self-pay | Admitting: Urology

## 2020-10-24 MED FILL — TESTOSTERONE CYP 200 MG/ML: 200 | 28 days supply | Qty: 4 | Fill #0

## 2020-11-21 DIAGNOSIS — H40023 Open angle with borderline findings, high risk, bilateral: Secondary | ICD-10-CM | POA: Diagnosis not present

## 2020-11-23 DIAGNOSIS — E7800 Pure hypercholesterolemia, unspecified: Secondary | ICD-10-CM | POA: Diagnosis not present

## 2020-12-02 DIAGNOSIS — H40023 Open angle with borderline findings, high risk, bilateral: Secondary | ICD-10-CM | POA: Diagnosis not present

## 2020-12-02 MED FILL — TESTOSTERONE CYP 200 MG/ML: 200 | 28 days supply | Qty: 4 | Fill #1

## 2020-12-12 DIAGNOSIS — Z20822 Contact with and (suspected) exposure to covid-19: Secondary | ICD-10-CM | POA: Diagnosis not present

## 2020-12-27 DIAGNOSIS — Z20822 Contact with and (suspected) exposure to covid-19: Secondary | ICD-10-CM | POA: Diagnosis not present

## 2020-12-28 MED FILL — TESTOSTERONE CYP 200 MG/ML: 200 | 28 days supply | Qty: 4 | Fill #2

## 2021-01-03 DIAGNOSIS — M25832 Other specified joint disorders, left wrist: Secondary | ICD-10-CM | POA: Diagnosis not present

## 2021-01-03 DIAGNOSIS — M25831 Other specified joint disorders, right wrist: Secondary | ICD-10-CM | POA: Diagnosis not present

## 2021-01-03 DIAGNOSIS — M25531 Pain in right wrist: Secondary | ICD-10-CM | POA: Diagnosis not present

## 2021-01-03 DIAGNOSIS — M25532 Pain in left wrist: Secondary | ICD-10-CM | POA: Diagnosis not present

## 2021-01-04 DIAGNOSIS — Z20822 Contact with and (suspected) exposure to covid-19: Secondary | ICD-10-CM | POA: Diagnosis not present

## 2021-01-25 MED FILL — TESTOSTERONE CYP 200 MG/ML: 200 | 28 days supply | Qty: 4 | Fill #3

## 2021-01-26 DIAGNOSIS — M25532 Pain in left wrist: Secondary | ICD-10-CM | POA: Diagnosis not present

## 2021-02-02 DIAGNOSIS — M25532 Pain in left wrist: Secondary | ICD-10-CM | POA: Diagnosis not present

## 2021-02-02 DIAGNOSIS — M25832 Other specified joint disorders, left wrist: Secondary | ICD-10-CM | POA: Diagnosis not present

## 2021-02-17 MED FILL — TESTOSTERONE CYP 200 MG/ML: 200 | 28 days supply | Qty: 4 | Fill #4

## 2021-03-17 MED FILL — TESTOSTERONE CYP 200 MG/ML: 200 | 28 days supply | Qty: 4 | Fill #5

## 2021-04-19 ENCOUNTER — Other Ambulatory Visit (HOSPITAL_COMMUNITY): Payer: Self-pay

## 2021-04-19 MED FILL — Testosterone Cypionate IM Inj in Oil 200 MG/ML: INTRAMUSCULAR | 28 days supply | Qty: 4 | Fill #0 | Status: AC

## 2021-05-11 DIAGNOSIS — E291 Testicular hypofunction: Secondary | ICD-10-CM | POA: Diagnosis not present

## 2021-05-11 DIAGNOSIS — Z125 Encounter for screening for malignant neoplasm of prostate: Secondary | ICD-10-CM | POA: Diagnosis not present

## 2021-05-16 ENCOUNTER — Other Ambulatory Visit: Payer: Self-pay | Admitting: Urology

## 2021-05-16 ENCOUNTER — Other Ambulatory Visit (HOSPITAL_COMMUNITY): Payer: Self-pay

## 2021-05-17 ENCOUNTER — Other Ambulatory Visit (HOSPITAL_COMMUNITY): Payer: Self-pay

## 2021-05-17 DIAGNOSIS — E7800 Pure hypercholesterolemia, unspecified: Secondary | ICD-10-CM | POA: Diagnosis not present

## 2021-05-18 DIAGNOSIS — N5201 Erectile dysfunction due to arterial insufficiency: Secondary | ICD-10-CM | POA: Diagnosis not present

## 2021-05-18 DIAGNOSIS — E291 Testicular hypofunction: Secondary | ICD-10-CM | POA: Diagnosis not present

## 2021-05-19 ENCOUNTER — Other Ambulatory Visit (HOSPITAL_COMMUNITY): Payer: Self-pay

## 2021-05-19 MED ORDER — TESTOSTERONE CYPIONATE 200 MG/ML IM SOLN
INTRAMUSCULAR | 4 refills | Status: AC
  Filled 2021-05-19: qty 4, 28d supply, fill #0
  Filled 2021-06-20: qty 4, 28d supply, fill #1
  Filled 2021-07-18: qty 4, 28d supply, fill #2
  Filled 2021-08-16: qty 4, 28d supply, fill #3
  Filled 2021-09-19: qty 4, 28d supply, fill #4
  Filled 2021-10-17: qty 4, 28d supply, fill #5

## 2021-05-26 ENCOUNTER — Other Ambulatory Visit (HOSPITAL_COMMUNITY): Payer: Self-pay

## 2021-05-26 MED ORDER — ATORVASTATIN CALCIUM 10 MG PO TABS
ORAL_TABLET | ORAL | 1 refills | Status: DC
  Filled 2021-05-26: qty 90, 90d supply, fill #0
  Filled 2021-08-31: qty 90, 90d supply, fill #1

## 2021-05-30 ENCOUNTER — Other Ambulatory Visit (HOSPITAL_COMMUNITY): Payer: Self-pay

## 2021-06-02 ENCOUNTER — Other Ambulatory Visit (HOSPITAL_COMMUNITY): Payer: Self-pay

## 2021-06-02 DIAGNOSIS — H40023 Open angle with borderline findings, high risk, bilateral: Secondary | ICD-10-CM | POA: Diagnosis not present

## 2021-06-02 MED ORDER — VUITY 1.25 % OP SOLN
OPHTHALMIC | 10 refills | Status: DC
  Filled 2021-06-02: qty 2.5, 30d supply, fill #0
  Filled 2021-06-09: qty 2.5, 25d supply, fill #0
  Filled 2021-07-18 – 2021-09-21 (×2): qty 2.5, 25d supply, fill #1
  Filled 2021-12-16 – 2022-01-10 (×2): qty 2.5, 25d supply, fill #2
  Filled 2022-05-30: qty 2.5, 25d supply, fill #3

## 2021-06-05 ENCOUNTER — Other Ambulatory Visit (HOSPITAL_COMMUNITY): Payer: Self-pay

## 2021-06-09 ENCOUNTER — Other Ambulatory Visit (HOSPITAL_COMMUNITY): Payer: Self-pay

## 2021-06-20 ENCOUNTER — Other Ambulatory Visit (HOSPITAL_COMMUNITY): Payer: Self-pay

## 2021-07-11 DIAGNOSIS — E7800 Pure hypercholesterolemia, unspecified: Secondary | ICD-10-CM | POA: Diagnosis not present

## 2021-07-18 ENCOUNTER — Other Ambulatory Visit (HOSPITAL_COMMUNITY): Payer: Self-pay

## 2021-07-19 ENCOUNTER — Other Ambulatory Visit (HOSPITAL_COMMUNITY): Payer: Self-pay

## 2021-07-27 ENCOUNTER — Other Ambulatory Visit (HOSPITAL_COMMUNITY): Payer: Self-pay

## 2021-08-17 ENCOUNTER — Other Ambulatory Visit (HOSPITAL_COMMUNITY): Payer: Self-pay

## 2021-08-22 DIAGNOSIS — E291 Testicular hypofunction: Secondary | ICD-10-CM | POA: Diagnosis not present

## 2021-08-22 DIAGNOSIS — Z0000 Encounter for general adult medical examination without abnormal findings: Secondary | ICD-10-CM | POA: Diagnosis not present

## 2021-08-22 DIAGNOSIS — R030 Elevated blood-pressure reading, without diagnosis of hypertension: Secondary | ICD-10-CM | POA: Diagnosis not present

## 2021-08-22 DIAGNOSIS — E7800 Pure hypercholesterolemia, unspecified: Secondary | ICD-10-CM | POA: Diagnosis not present

## 2021-08-31 ENCOUNTER — Other Ambulatory Visit (HOSPITAL_COMMUNITY): Payer: Self-pay

## 2021-09-01 ENCOUNTER — Other Ambulatory Visit (HOSPITAL_COMMUNITY): Payer: Self-pay

## 2021-09-02 ENCOUNTER — Other Ambulatory Visit (HOSPITAL_COMMUNITY): Payer: Self-pay

## 2021-09-20 ENCOUNTER — Other Ambulatory Visit (HOSPITAL_COMMUNITY): Payer: Self-pay

## 2021-09-21 ENCOUNTER — Other Ambulatory Visit (HOSPITAL_COMMUNITY): Payer: Self-pay

## 2021-09-22 ENCOUNTER — Other Ambulatory Visit (HOSPITAL_COMMUNITY): Payer: Self-pay

## 2021-10-18 ENCOUNTER — Other Ambulatory Visit (HOSPITAL_COMMUNITY): Payer: Self-pay

## 2021-10-25 DIAGNOSIS — E291 Testicular hypofunction: Secondary | ICD-10-CM | POA: Diagnosis not present

## 2021-10-25 DIAGNOSIS — Z125 Encounter for screening for malignant neoplasm of prostate: Secondary | ICD-10-CM | POA: Diagnosis not present

## 2021-10-31 ENCOUNTER — Other Ambulatory Visit (HOSPITAL_COMMUNITY): Payer: Self-pay

## 2021-10-31 DIAGNOSIS — E291 Testicular hypofunction: Secondary | ICD-10-CM | POA: Diagnosis not present

## 2021-10-31 DIAGNOSIS — N5201 Erectile dysfunction due to arterial insufficiency: Secondary | ICD-10-CM | POA: Diagnosis not present

## 2021-10-31 MED ORDER — TESTOSTERONE CYPIONATE 200 MG/ML IM SOLN
INTRAMUSCULAR | 4 refills | Status: DC
  Filled 2021-10-31: qty 4, 28d supply, fill #0
  Filled 2021-12-12: qty 10, 70d supply, fill #1
  Filled 2022-02-20: qty 10, 70d supply, fill #2
  Filled 2022-04-16: qty 10, 70d supply, fill #3
  Filled 2022-04-16 – 2022-04-24 (×2): qty 5, 35d supply, fill #3

## 2021-11-07 ENCOUNTER — Other Ambulatory Visit (HOSPITAL_COMMUNITY): Payer: Self-pay

## 2021-11-27 ENCOUNTER — Other Ambulatory Visit (HOSPITAL_COMMUNITY): Payer: Self-pay

## 2021-11-29 ENCOUNTER — Other Ambulatory Visit (HOSPITAL_COMMUNITY): Payer: Self-pay

## 2021-11-29 MED ORDER — ATORVASTATIN CALCIUM 10 MG PO TABS
10.0000 mg | ORAL_TABLET | Freq: Every day | ORAL | 2 refills | Status: AC
  Filled 2021-11-29: qty 30, 30d supply, fill #0
  Filled 2021-12-28: qty 30, 30d supply, fill #1
  Filled 2022-01-27: qty 30, 30d supply, fill #2
  Filled 2022-03-01: qty 30, 30d supply, fill #3
  Filled 2022-03-27: qty 30, 30d supply, fill #4
  Filled 2022-04-25: qty 30, 30d supply, fill #5
  Filled 2022-05-29: qty 30, 30d supply, fill #6
  Filled 2022-06-28: qty 30, 30d supply, fill #7
  Filled 2022-07-29: qty 30, 30d supply, fill #8

## 2021-12-13 ENCOUNTER — Other Ambulatory Visit (HOSPITAL_COMMUNITY): Payer: Self-pay

## 2021-12-19 DIAGNOSIS — H40023 Open angle with borderline findings, high risk, bilateral: Secondary | ICD-10-CM | POA: Diagnosis not present

## 2021-12-20 ENCOUNTER — Other Ambulatory Visit (HOSPITAL_COMMUNITY): Payer: Self-pay

## 2021-12-28 ENCOUNTER — Other Ambulatory Visit (HOSPITAL_COMMUNITY): Payer: Self-pay

## 2021-12-29 ENCOUNTER — Other Ambulatory Visit (HOSPITAL_COMMUNITY): Payer: Self-pay

## 2022-01-10 ENCOUNTER — Other Ambulatory Visit (HOSPITAL_COMMUNITY): Payer: Self-pay

## 2022-01-15 ENCOUNTER — Other Ambulatory Visit (HOSPITAL_COMMUNITY): Payer: Self-pay

## 2022-01-15 ENCOUNTER — Other Ambulatory Visit: Payer: Self-pay | Admitting: Podiatry

## 2022-01-15 ENCOUNTER — Ambulatory Visit (INDEPENDENT_AMBULATORY_CARE_PROVIDER_SITE_OTHER): Payer: BLUE CROSS/BLUE SHIELD

## 2022-01-15 ENCOUNTER — Other Ambulatory Visit: Payer: Self-pay

## 2022-01-15 ENCOUNTER — Ambulatory Visit: Payer: BLUE CROSS/BLUE SHIELD | Admitting: Podiatry

## 2022-01-15 DIAGNOSIS — M7989 Other specified soft tissue disorders: Secondary | ICD-10-CM | POA: Diagnosis not present

## 2022-01-15 DIAGNOSIS — M778 Other enthesopathies, not elsewhere classified: Secondary | ICD-10-CM

## 2022-01-15 DIAGNOSIS — M79671 Pain in right foot: Secondary | ICD-10-CM

## 2022-01-15 DIAGNOSIS — S92911A Unspecified fracture of right toe(s), initial encounter for closed fracture: Secondary | ICD-10-CM

## 2022-01-15 MED ORDER — MELOXICAM 15 MG PO TABS
15.0000 mg | ORAL_TABLET | Freq: Every day | ORAL | 0 refills | Status: AC
  Filled 2022-01-15 (×2): qty 30, 30d supply, fill #0

## 2022-01-15 MED ORDER — MELOXICAM 15 MG PO TABS: 15.0000 mg | ORAL_TABLET | Freq: Every day | ORAL | 0 refills | Status: DC | PRN

## 2022-01-15 NOTE — Progress Notes (Signed)
Dg  

## 2022-01-15 NOTE — Patient Instructions (Signed)
Toe Fracture A toe fracture is a break in one of the toe bones (phalanges). This may happen if you: Drop a heavy object on your toe. Stub your toe. Twist your toe. Exercise the same way too much. What are the signs or symptoms? The main symptoms are swelling and pain in the toe. You may also have: Bruising. Stiffness. Numbness. A change in the way the toe looks. Broken bones that poke through the skin. Blood under the toenail. How is this treated? Treatments may include: Taping the broken toe to a toe that is next to it (buddy taping). Wearing a shoe that has a wide, rigid sole to protect the toe and to limit its movement. Wearing a cast. Surgery. This may be needed if the: Pieces of broken bone are out of place. Bone pokes through the skin. Physical therapy. Follow these instructions at home: If you have a shoe: Wear the shoe as told by your doctor. Remove it only as told by your doctor. Loosen the shoe if your toes tingle, become numb, or turn cold and blue. Keep the shoe clean and dry. If you have a cast: Do not put pressure on any part of the cast until it is fully hardened. This may take a few hours. Do not stick anything inside the cast to scratch your skin. Check the skin around the cast every day. Tell your doctor about any concerns. You may put lotion on dry skin around the edges of the cast. Do not put lotion on the skin under the cast. Keep the cast clean and dry. Bathing Do not take baths, swim, or use a hot tub until your doctor says it is okay. Ask your doctor if you can take showers. If the shoe or cast is not waterproof: Do not let it get wet. Cover it with a watertight covering when you take a bath or a shower. Activity Do not use your foot to support your body weight until your doctor says it is okay. Use crutches as told by your doctor. Ask your doctor what activities are safe for you during recovery. Avoid activities as told by your doctor. Do  exercises as told by your doctor or therapist. Driving Do not drive or use heavy machinery while taking pain medicine. Do not drive while wearing a cast on a foot that you use for driving. Managing pain, stiffness, and swelling  Put ice on the injured area if told by your doctor: Put ice in a plastic bag. Place a towel between your skin and the bag. If you have a shoe, remove it as told by your doctor. If you have a cast, place a towel between your cast and the bag. Leave the ice on for 20 minutes, 2-3 times per day. Raise (elevate) the injured area above the level of your heart while you are sitting or lying down. General instructions If your toe was taped to a toe that is next to it, follow your doctor's instructions for changing the gauze and tape. Change it more often: If the gauze and tape get wet. If this happens, dry the space between the toes. If the gauze and tape are too tight and they cause your toe to become pale or to lose feeling (go numb). If your doctor did not give you a protective shoe, wear sturdy shoes that support your foot. Your shoes should not: Pinch your toes. Fit tightly against your toes. Do not use any tobacco products, including cigarettes, chewing tobacco, or e-cigarettes.   These can delay bone healing. If you need help quitting, ask your doctor. Take medicines only as told by your doctor. Keep all follow-up visits as told by your doctor. This is important. Contact a doctor if: Your pain medicine is not helping. You have a fever. You notice a bad smell coming from your cast. Get help right away if: You lose feeling (have numbness) in your toe or foot, and it is getting worse. Your toe or your foot tingles. Your toe or your foot gets cold or turns blue. You have redness or swelling in your toe or foot, and it is getting worse. You have very bad pain. Summary A toe fracture is a break in one of the toe bones. Use ice and raise your foot. This will help  lessen pain and swelling. Use crutches as told by your doctor. This information is not intended to replace advice given to you by your health care provider. Make sure you discuss any questions you have with your health care provider. Document Revised: 05/15/2021 Document Reviewed: 05/15/2021 Elsevier Patient Education  2022 Elsevier Inc.  

## 2022-01-18 NOTE — Progress Notes (Signed)
Subjective:   Patient ID: Bill Ball, male   DOB: 51 y.o.   MRN: [id number]   HPI 51 year old male presents the office today with concerns of pain to his right big toe which started Saturday night after he hit his toe on a chair.  Since then he has noticed swelling, redness.  He has not seen any drainage or pus from the toenail.  He had no recent treatment.  He has no other concerns today.   Review of Systems  All other systems reviewed and are negative.  No past medical history on file.  No past surgical history on file.   Current Outpatient Medications:    meloxicam (MOBIC) 15 MG tablet, Take 1 tablet (15 mg total) by mouth daily., Disp: 30 tablet, Rfl: 0   [DISCONTINUED] meloxicam (MOBIC) 15 MG tablet, Take 1 tablet (15 mg total) by mouth daily as needed for pain., Disp: 30 tablet, Rfl: 0   atorvastatin (LIPITOR) 10 MG tablet, Take 1 tablet by mouth once daily, Disp: 90 tablet, Rfl: 2   clindamycin (CLEOCIN) 300 MG capsule, Take 1 capsule (300 mg total) by mouth 3 (three) times daily. (Patient not taking: Reported on 11/30/2018), Disp: 30 capsule, Rfl: 0   Olopatadine HCl 0.2 % SOLN, Apply 1 drop to eye daily., Disp: 2.5 mL, Rfl: 0   predniSONE (DELTASONE) 50 MG tablet, Take 1 tablet (50 mg total) by mouth daily., Disp: 5 tablet, Rfl: 0   testosterone cypionate (DEPOTESTOSTERONE CYPIONATE) 200 MG/ML injection, INJECT 0.75 ML INTO THE MUSCLE EVERY 7 DAYS, Disp: 10 mL, Rfl: 4   testosterone cypionate (DEPOTESTOSTERONE CYPIONATE) 200 MG/ML injection, Inject 0.70mL intramuscularly every 7 days, Disp: 10 mL, Rfl: 4   testosterone cypionate (DEPOTESTOSTERONE CYPIONATE) 200 MG/ML injection, Inject 0.75 ml into the muscle  every 7 days., Disp: 10 mL, Rfl: 4   VUITY 1.25 % SOLN, Instill 1 drop into both eyes as directed daily for at least 1-2 weeks then as needed, Disp: 2.5 mL, Rfl: 10  No Known Allergies        Objective:  Physical Exam  General: AAO x3, NAD  Dermatological: Skin is  warm, dry and supple bilateral.  There is no drainage or pus or signs of infection of the toenail sites today.  Vascular: Dorsalis Pedis artery and Posterior Tibial artery pedal pulses are 2/4 bilateral with immedate capillary fill time. There is no pain with calf compression, swelling, warmth, erythema.   Neruologic: Grossly intact via light touch bilateral.   Musculoskeletal: There is localized edema present along the first MPJ and along the hallux.  Mild erythema associate with the swelling but there is no increased warmth.  Tenderness mostly in the first MPJ.  There is decreased range of motion of the first MPJ.  No area of pinpoint tenderness otherwise.  Muscular strength 5/5 in all groups tested bilateral.  Gait: Unassisted, Nonantalgic.       Assessment:   51 year old male with capsulitis, possible fracture right hallux     Plan:  -Treatment options discussed including all alternatives, risks, and complications -Etiology of symptoms were discussed -X-rays were obtained and reviewed with the patient. Changes present to the first MPJ which is chronic.  Possible avulsion off the proximal phalanx laterally versus due to arthritis. -Given his pain recommend immobilization in a surgical shoe.  Prescribed meloxicam.  Discussed ice elevation.  There is edema noted of the hallux around the toenail but there is no signs of infection today.  He is to  monitor closely for this.  I will keep a small amount of antibiotic ointment along the toenail.  Return in about 3 weeks (around 02/05/2022).  Vivi Barrack DPM

## 2022-01-25 ENCOUNTER — Other Ambulatory Visit (HOSPITAL_COMMUNITY): Payer: Self-pay

## 2022-01-25 DIAGNOSIS — N390 Urinary tract infection, site not specified: Secondary | ICD-10-CM | POA: Diagnosis not present

## 2022-01-25 DIAGNOSIS — L02214 Cutaneous abscess of groin: Secondary | ICD-10-CM | POA: Diagnosis not present

## 2022-01-25 DIAGNOSIS — B951 Streptococcus, group B, as the cause of diseases classified elsewhere: Secondary | ICD-10-CM | POA: Diagnosis not present

## 2022-01-25 MED ORDER — SULFAMETHOXAZOLE-TRIMETHOPRIM 800-160 MG PO TABS
ORAL_TABLET | ORAL | 0 refills | Status: AC
  Filled 2022-01-25: qty 20, 10d supply, fill #0

## 2022-01-25 MED ORDER — HYDROCODONE-ACETAMINOPHEN 5-325 MG PO TABS
ORAL_TABLET | ORAL | 0 refills | Status: AC
  Filled 2022-01-25: qty 10, 3d supply, fill #0

## 2022-01-26 ENCOUNTER — Other Ambulatory Visit (HOSPITAL_COMMUNITY): Payer: Self-pay

## 2022-01-29 ENCOUNTER — Other Ambulatory Visit (HOSPITAL_COMMUNITY): Payer: Self-pay

## 2022-02-05 ENCOUNTER — Ambulatory Visit: Payer: BLUE CROSS/BLUE SHIELD | Admitting: Podiatry

## 2022-02-08 ENCOUNTER — Other Ambulatory Visit (HOSPITAL_COMMUNITY): Payer: Self-pay

## 2022-02-08 DIAGNOSIS — L02214 Cutaneous abscess of groin: Secondary | ICD-10-CM | POA: Diagnosis not present

## 2022-02-20 ENCOUNTER — Other Ambulatory Visit (HOSPITAL_COMMUNITY): Payer: Self-pay

## 2022-03-02 ENCOUNTER — Other Ambulatory Visit (HOSPITAL_COMMUNITY): Payer: Self-pay

## 2022-03-28 ENCOUNTER — Other Ambulatory Visit (HOSPITAL_COMMUNITY): Payer: Self-pay

## 2022-03-30 ENCOUNTER — Other Ambulatory Visit (HOSPITAL_COMMUNITY): Payer: Self-pay

## 2022-04-16 ENCOUNTER — Other Ambulatory Visit (HOSPITAL_COMMUNITY): Payer: Self-pay

## 2022-04-24 ENCOUNTER — Other Ambulatory Visit (HOSPITAL_COMMUNITY): Payer: Self-pay

## 2022-04-24 DIAGNOSIS — E291 Testicular hypofunction: Secondary | ICD-10-CM | POA: Diagnosis not present

## 2022-04-26 ENCOUNTER — Other Ambulatory Visit (HOSPITAL_COMMUNITY): Payer: Self-pay

## 2022-05-01 DIAGNOSIS — E291 Testicular hypofunction: Secondary | ICD-10-CM | POA: Diagnosis not present

## 2022-05-01 DIAGNOSIS — N5201 Erectile dysfunction due to arterial insufficiency: Secondary | ICD-10-CM | POA: Diagnosis not present

## 2022-05-30 ENCOUNTER — Other Ambulatory Visit (HOSPITAL_COMMUNITY): Payer: Self-pay

## 2022-05-31 ENCOUNTER — Other Ambulatory Visit (HOSPITAL_COMMUNITY): Payer: Self-pay

## 2022-06-02 ENCOUNTER — Other Ambulatory Visit (HOSPITAL_COMMUNITY): Payer: Self-pay

## 2022-06-19 DIAGNOSIS — H40023 Open angle with borderline findings, high risk, bilateral: Secondary | ICD-10-CM | POA: Diagnosis not present

## 2022-06-28 ENCOUNTER — Other Ambulatory Visit (HOSPITAL_COMMUNITY): Payer: Self-pay

## 2022-06-29 ENCOUNTER — Other Ambulatory Visit (HOSPITAL_COMMUNITY): Payer: Self-pay

## 2022-07-02 ENCOUNTER — Other Ambulatory Visit (HOSPITAL_COMMUNITY): Payer: Self-pay

## 2022-07-02 MED ORDER — TESTOSTERONE CYPIONATE 200 MG/ML IM SOLN
INTRAMUSCULAR | 4 refills | Status: DC
  Filled 2022-07-02: qty 4, 28d supply, fill #0
  Filled 2022-07-03: qty 6, 42d supply, fill #0
  Filled 2022-09-22: qty 10, 70d supply, fill #1
  Filled 2022-11-27: qty 10, 70d supply, fill #2

## 2022-07-03 ENCOUNTER — Other Ambulatory Visit (HOSPITAL_COMMUNITY): Payer: Self-pay

## 2022-07-30 ENCOUNTER — Other Ambulatory Visit (HOSPITAL_COMMUNITY): Payer: Self-pay

## 2022-08-02 ENCOUNTER — Other Ambulatory Visit (HOSPITAL_COMMUNITY): Payer: Self-pay

## 2022-08-29 ENCOUNTER — Other Ambulatory Visit (HOSPITAL_COMMUNITY): Payer: Self-pay

## 2022-08-31 ENCOUNTER — Other Ambulatory Visit (HOSPITAL_COMMUNITY): Payer: Self-pay

## 2022-08-31 DIAGNOSIS — Z79899 Other long term (current) drug therapy: Secondary | ICD-10-CM | POA: Diagnosis not present

## 2022-08-31 DIAGNOSIS — R030 Elevated blood-pressure reading, without diagnosis of hypertension: Secondary | ICD-10-CM | POA: Diagnosis not present

## 2022-08-31 DIAGNOSIS — Z23 Encounter for immunization: Secondary | ICD-10-CM | POA: Diagnosis not present

## 2022-08-31 DIAGNOSIS — E7800 Pure hypercholesterolemia, unspecified: Secondary | ICD-10-CM | POA: Diagnosis not present

## 2022-08-31 DIAGNOSIS — Z0000 Encounter for general adult medical examination without abnormal findings: Secondary | ICD-10-CM | POA: Diagnosis not present

## 2022-08-31 DIAGNOSIS — E291 Testicular hypofunction: Secondary | ICD-10-CM | POA: Diagnosis not present

## 2022-08-31 MED ORDER — ATORVASTATIN CALCIUM 10 MG PO TABS
ORAL_TABLET | ORAL | 3 refills | Status: DC
  Filled 2022-08-31: qty 90, 90d supply, fill #0
  Filled 2022-11-27: qty 90, 90d supply, fill #1
  Filled 2023-03-01: qty 90, 90d supply, fill #2
  Filled 2023-05-31: qty 90, 90d supply, fill #3

## 2022-09-12 ENCOUNTER — Other Ambulatory Visit (HOSPITAL_COMMUNITY): Payer: Self-pay

## 2022-09-22 ENCOUNTER — Other Ambulatory Visit (HOSPITAL_COMMUNITY): Payer: Self-pay

## 2022-09-24 ENCOUNTER — Other Ambulatory Visit (HOSPITAL_COMMUNITY): Payer: Self-pay

## 2022-09-24 MED ORDER — VUITY 1.25 % OP SOLN
OPHTHALMIC | 10 refills | Status: DC
  Filled 2022-09-24: qty 2.5, 25d supply, fill #0
  Filled 2022-12-28 – 2023-01-19 (×2): qty 2.5, 25d supply, fill #1
  Filled 2023-05-23: qty 2.5, 25d supply, fill #2

## 2022-09-25 ENCOUNTER — Other Ambulatory Visit (HOSPITAL_COMMUNITY): Payer: Self-pay

## 2022-10-04 ENCOUNTER — Other Ambulatory Visit (HOSPITAL_COMMUNITY): Payer: Self-pay

## 2022-10-29 DIAGNOSIS — Z125 Encounter for screening for malignant neoplasm of prostate: Secondary | ICD-10-CM | POA: Diagnosis not present

## 2022-10-29 DIAGNOSIS — E291 Testicular hypofunction: Secondary | ICD-10-CM | POA: Diagnosis not present

## 2022-11-05 DIAGNOSIS — E291 Testicular hypofunction: Secondary | ICD-10-CM | POA: Diagnosis not present

## 2022-11-05 DIAGNOSIS — N5201 Erectile dysfunction due to arterial insufficiency: Secondary | ICD-10-CM | POA: Diagnosis not present

## 2022-11-28 ENCOUNTER — Other Ambulatory Visit (HOSPITAL_COMMUNITY): Payer: Self-pay

## 2022-11-29 ENCOUNTER — Other Ambulatory Visit (HOSPITAL_COMMUNITY): Payer: Self-pay

## 2022-12-28 ENCOUNTER — Other Ambulatory Visit (HOSPITAL_COMMUNITY): Payer: Self-pay

## 2022-12-29 ENCOUNTER — Other Ambulatory Visit (HOSPITAL_COMMUNITY): Payer: Self-pay

## 2022-12-31 ENCOUNTER — Other Ambulatory Visit (HOSPITAL_COMMUNITY): Payer: Self-pay

## 2023-01-15 ENCOUNTER — Other Ambulatory Visit (HOSPITAL_COMMUNITY): Payer: Self-pay

## 2023-01-19 ENCOUNTER — Other Ambulatory Visit (HOSPITAL_COMMUNITY): Payer: Self-pay

## 2023-01-22 DIAGNOSIS — G4719 Other hypersomnia: Secondary | ICD-10-CM | POA: Diagnosis not present

## 2023-01-22 DIAGNOSIS — E669 Obesity, unspecified: Secondary | ICD-10-CM | POA: Diagnosis not present

## 2023-01-23 ENCOUNTER — Other Ambulatory Visit (HOSPITAL_COMMUNITY): Payer: Self-pay

## 2023-02-01 ENCOUNTER — Other Ambulatory Visit (HOSPITAL_COMMUNITY): Payer: Self-pay

## 2023-02-08 ENCOUNTER — Other Ambulatory Visit (HOSPITAL_COMMUNITY): Payer: Self-pay

## 2023-02-12 ENCOUNTER — Other Ambulatory Visit (HOSPITAL_COMMUNITY): Payer: Self-pay

## 2023-02-12 MED ORDER — TESTOSTERONE CYPIONATE 200 MG/ML IM SOLN
150.0000 mg | INTRAMUSCULAR | 4 refills | Status: AC
Start: 2023-02-11 — End: ?
  Filled 2023-02-12: qty 10, 70d supply, fill #0
  Filled 2023-04-20: qty 10, 70d supply, fill #1
  Filled 2023-07-10: qty 10, 70d supply, fill #2

## 2023-02-28 DIAGNOSIS — J343 Hypertrophy of nasal turbinates: Secondary | ICD-10-CM | POA: Diagnosis not present

## 2023-02-28 DIAGNOSIS — R0683 Snoring: Secondary | ICD-10-CM | POA: Diagnosis not present

## 2023-03-01 ENCOUNTER — Other Ambulatory Visit (HOSPITAL_COMMUNITY): Payer: Self-pay

## 2023-03-12 DIAGNOSIS — G4733 Obstructive sleep apnea (adult) (pediatric): Secondary | ICD-10-CM | POA: Diagnosis not present

## 2023-03-14 ENCOUNTER — Other Ambulatory Visit (HOSPITAL_COMMUNITY): Payer: Self-pay

## 2023-04-17 DIAGNOSIS — G4733 Obstructive sleep apnea (adult) (pediatric): Secondary | ICD-10-CM | POA: Diagnosis not present

## 2023-04-20 ENCOUNTER — Other Ambulatory Visit (HOSPITAL_COMMUNITY): Payer: Self-pay

## 2023-04-26 DIAGNOSIS — J392 Other diseases of pharynx: Secondary | ICD-10-CM | POA: Diagnosis not present

## 2023-04-26 DIAGNOSIS — G4733 Obstructive sleep apnea (adult) (pediatric): Secondary | ICD-10-CM | POA: Diagnosis not present

## 2023-05-17 DIAGNOSIS — G4733 Obstructive sleep apnea (adult) (pediatric): Secondary | ICD-10-CM | POA: Diagnosis not present

## 2023-05-23 ENCOUNTER — Other Ambulatory Visit (HOSPITAL_COMMUNITY): Payer: Self-pay

## 2023-05-24 ENCOUNTER — Other Ambulatory Visit (HOSPITAL_COMMUNITY): Payer: Self-pay

## 2023-05-25 ENCOUNTER — Other Ambulatory Visit (HOSPITAL_COMMUNITY): Payer: Self-pay

## 2023-06-17 DIAGNOSIS — G4733 Obstructive sleep apnea (adult) (pediatric): Secondary | ICD-10-CM | POA: Diagnosis not present

## 2023-06-18 DIAGNOSIS — G4733 Obstructive sleep apnea (adult) (pediatric): Secondary | ICD-10-CM | POA: Diagnosis not present

## 2023-06-18 DIAGNOSIS — R030 Elevated blood-pressure reading, without diagnosis of hypertension: Secondary | ICD-10-CM | POA: Diagnosis not present

## 2023-07-03 ENCOUNTER — Other Ambulatory Visit (HOSPITAL_COMMUNITY): Payer: Self-pay

## 2023-07-10 ENCOUNTER — Other Ambulatory Visit (HOSPITAL_COMMUNITY): Payer: Self-pay

## 2023-07-10 ENCOUNTER — Other Ambulatory Visit: Payer: Self-pay

## 2023-07-17 DIAGNOSIS — G4733 Obstructive sleep apnea (adult) (pediatric): Secondary | ICD-10-CM | POA: Diagnosis not present

## 2023-08-30 ENCOUNTER — Other Ambulatory Visit (HOSPITAL_COMMUNITY): Payer: Self-pay

## 2023-08-30 DIAGNOSIS — H40023 Open angle with borderline findings, high risk, bilateral: Secondary | ICD-10-CM | POA: Diagnosis not present

## 2023-08-30 MED ORDER — LOTEPREDNOL ETABONATE 0.5 % OP SUSP
OPHTHALMIC | 1 refills | Status: AC
Start: 2023-08-30 — End: ?
  Filled 2023-08-30: qty 5, 17d supply, fill #0

## 2023-09-02 ENCOUNTER — Other Ambulatory Visit (HOSPITAL_COMMUNITY): Payer: Self-pay

## 2023-09-03 ENCOUNTER — Other Ambulatory Visit (HOSPITAL_COMMUNITY): Payer: Self-pay

## 2023-09-03 MED ORDER — ATORVASTATIN CALCIUM 10 MG PO TABS
10.0000 mg | ORAL_TABLET | Freq: Every day | ORAL | 0 refills | Status: DC
  Filled 2023-09-03: qty 90, 90d supply, fill #0

## 2023-09-06 DIAGNOSIS — H40023 Open angle with borderline findings, high risk, bilateral: Secondary | ICD-10-CM | POA: Diagnosis not present

## 2023-09-17 DIAGNOSIS — E7800 Pure hypercholesterolemia, unspecified: Secondary | ICD-10-CM | POA: Diagnosis not present

## 2023-09-17 DIAGNOSIS — Z79899 Other long term (current) drug therapy: Secondary | ICD-10-CM | POA: Diagnosis not present

## 2023-09-20 ENCOUNTER — Other Ambulatory Visit (HOSPITAL_COMMUNITY): Payer: Self-pay

## 2023-09-20 MED ORDER — TESTOSTERONE CYPIONATE 200 MG/ML IM SOLN
150.0000 mg | INTRAMUSCULAR | 4 refills | Status: AC
  Filled 2023-09-20 (×2): qty 5, 35d supply, fill #0
  Filled 2023-12-12: qty 10, 70d supply, fill #1
  Filled 2024-02-14: qty 9, 63d supply, fill #2
  Filled 2024-02-14: qty 10, 70d supply, fill #2

## 2023-09-23 DIAGNOSIS — Z0000 Encounter for general adult medical examination without abnormal findings: Secondary | ICD-10-CM | POA: Diagnosis not present

## 2023-09-23 DIAGNOSIS — Z23 Encounter for immunization: Secondary | ICD-10-CM | POA: Diagnosis not present

## 2023-10-29 DIAGNOSIS — E291 Testicular hypofunction: Secondary | ICD-10-CM | POA: Diagnosis not present

## 2023-10-29 DIAGNOSIS — Z125 Encounter for screening for malignant neoplasm of prostate: Secondary | ICD-10-CM | POA: Diagnosis not present

## 2023-11-05 ENCOUNTER — Other Ambulatory Visit (HOSPITAL_COMMUNITY): Payer: Self-pay

## 2023-11-05 DIAGNOSIS — E291 Testicular hypofunction: Secondary | ICD-10-CM | POA: Diagnosis not present

## 2023-11-05 DIAGNOSIS — N5201 Erectile dysfunction due to arterial insufficiency: Secondary | ICD-10-CM | POA: Diagnosis not present

## 2023-11-05 MED ORDER — TESTOSTERONE CYPIONATE 200 MG/ML IM SOLN
150.0000 mg | INTRAMUSCULAR | 4 refills | Status: DC
  Filled 2023-11-05 – 2024-04-07 (×2): qty 10, 70d supply, fill #0

## 2023-11-06 ENCOUNTER — Other Ambulatory Visit (HOSPITAL_COMMUNITY): Payer: Self-pay

## 2023-11-20 ENCOUNTER — Other Ambulatory Visit (HOSPITAL_COMMUNITY): Payer: Self-pay

## 2023-11-27 ENCOUNTER — Other Ambulatory Visit (HOSPITAL_COMMUNITY): Payer: Self-pay

## 2023-11-29 ENCOUNTER — Other Ambulatory Visit: Payer: Self-pay

## 2023-11-29 ENCOUNTER — Other Ambulatory Visit (HOSPITAL_COMMUNITY): Payer: Self-pay

## 2023-11-29 MED ORDER — VUITY 1.25 % OP SOLN
1.0000 [drp] | Freq: Every day | OPHTHALMIC | 10 refills | Status: AC
  Filled 2023-11-29: qty 2.5, 25d supply, fill #0
  Filled 2024-07-23: qty 2.5, 25d supply, fill #1

## 2023-11-29 MED ORDER — ATORVASTATIN CALCIUM 10 MG PO TABS
10.0000 mg | ORAL_TABLET | Freq: Every day | ORAL | 3 refills | Status: DC
  Filled 2023-11-29: qty 90, 90d supply, fill #0
  Filled 2024-03-02: qty 90, 90d supply, fill #1
  Filled 2024-06-03: qty 90, 90d supply, fill #2
  Filled 2024-09-01: qty 90, 90d supply, fill #3

## 2023-12-02 ENCOUNTER — Other Ambulatory Visit (HOSPITAL_COMMUNITY): Payer: Self-pay

## 2023-12-13 ENCOUNTER — Other Ambulatory Visit: Payer: Self-pay

## 2023-12-24 ENCOUNTER — Other Ambulatory Visit (HOSPITAL_COMMUNITY): Payer: Self-pay

## 2024-01-08 ENCOUNTER — Other Ambulatory Visit (HOSPITAL_COMMUNITY): Payer: Self-pay

## 2024-01-13 ENCOUNTER — Other Ambulatory Visit (HOSPITAL_COMMUNITY): Payer: Self-pay

## 2024-01-17 ENCOUNTER — Other Ambulatory Visit (HOSPITAL_COMMUNITY): Payer: Self-pay

## 2024-02-14 ENCOUNTER — Other Ambulatory Visit (HOSPITAL_COMMUNITY): Payer: Self-pay

## 2024-02-29 DIAGNOSIS — S0501XA Injury of conjunctiva and corneal abrasion without foreign body, right eye, initial encounter: Secondary | ICD-10-CM | POA: Diagnosis not present

## 2024-03-02 DIAGNOSIS — S0501XD Injury of conjunctiva and corneal abrasion without foreign body, right eye, subsequent encounter: Secondary | ICD-10-CM | POA: Diagnosis not present

## 2024-03-18 DIAGNOSIS — R051 Acute cough: Secondary | ICD-10-CM | POA: Diagnosis not present

## 2024-03-19 DIAGNOSIS — H40023 Open angle with borderline findings, high risk, bilateral: Secondary | ICD-10-CM | POA: Diagnosis not present

## 2024-03-27 ENCOUNTER — Other Ambulatory Visit (HOSPITAL_COMMUNITY): Payer: Self-pay

## 2024-03-27 MED ORDER — AZITHROMYCIN 250 MG PO TABS
ORAL_TABLET | ORAL | 0 refills | Status: AC
  Filled 2024-03-27: qty 6, 5d supply, fill #0

## 2024-04-07 ENCOUNTER — Other Ambulatory Visit (HOSPITAL_COMMUNITY): Payer: Self-pay

## 2024-04-09 ENCOUNTER — Other Ambulatory Visit (HOSPITAL_COMMUNITY): Payer: Self-pay

## 2024-05-19 ENCOUNTER — Other Ambulatory Visit (HOSPITAL_COMMUNITY): Payer: Self-pay

## 2024-05-27 ENCOUNTER — Other Ambulatory Visit (HOSPITAL_COMMUNITY): Payer: Self-pay

## 2024-05-27 DIAGNOSIS — H6122 Impacted cerumen, left ear: Secondary | ICD-10-CM | POA: Diagnosis not present

## 2024-05-27 DIAGNOSIS — H60392 Other infective otitis externa, left ear: Secondary | ICD-10-CM | POA: Diagnosis not present

## 2024-05-27 DIAGNOSIS — H6992 Unspecified Eustachian tube disorder, left ear: Secondary | ICD-10-CM | POA: Diagnosis not present

## 2024-05-27 MED ORDER — TOBRAMYCIN-DEXAMETHASONE 0.3-0.1 % OP SUSP
1.0000 [drp] | OPHTHALMIC | 0 refills | Status: AC
  Filled 2024-05-27: qty 5, 7d supply, fill #0

## 2024-06-03 ENCOUNTER — Other Ambulatory Visit (HOSPITAL_COMMUNITY): Payer: Self-pay

## 2024-06-05 ENCOUNTER — Other Ambulatory Visit (HOSPITAL_COMMUNITY): Payer: Self-pay

## 2024-06-05 MED ORDER — TESTOSTERONE CYPIONATE 200 MG/ML IM SOLN
INTRAMUSCULAR | 4 refills | Status: AC
Start: 2024-06-05 — End: ?
  Filled 2024-06-05: qty 10, 70d supply, fill #0
  Filled 2024-06-05: qty 10, 90d supply, fill #0
  Filled 2024-07-01: qty 4, 28d supply, fill #0
  Filled 2024-07-06: qty 10, 70d supply, fill #0
  Filled 2024-09-01: qty 10, 70d supply, fill #1
  Filled 2024-11-17: qty 10, 70d supply, fill #2

## 2024-06-08 ENCOUNTER — Other Ambulatory Visit (HOSPITAL_COMMUNITY): Payer: Self-pay

## 2024-07-01 ENCOUNTER — Other Ambulatory Visit: Payer: Self-pay

## 2024-07-01 ENCOUNTER — Other Ambulatory Visit (HOSPITAL_COMMUNITY): Payer: Self-pay

## 2024-07-06 ENCOUNTER — Other Ambulatory Visit (HOSPITAL_COMMUNITY): Payer: Self-pay

## 2024-07-07 ENCOUNTER — Other Ambulatory Visit: Payer: Self-pay

## 2024-07-07 ENCOUNTER — Other Ambulatory Visit (HOSPITAL_COMMUNITY): Payer: Self-pay

## 2024-07-07 DIAGNOSIS — L218 Other seborrheic dermatitis: Secondary | ICD-10-CM | POA: Diagnosis not present

## 2024-07-07 MED ORDER — MOMETASONE FUROATE 0.1 % EX OINT
TOPICAL_OINTMENT | CUTANEOUS | 0 refills | Status: AC
  Filled 2024-07-07: qty 45, 30d supply, fill #0

## 2024-07-07 MED ORDER — KETOCONAZOLE 2 % EX SHAM
MEDICATED_SHAMPOO | CUTANEOUS | 6 refills | Status: AC
  Filled 2024-07-07: qty 120, 30d supply, fill #0

## 2024-07-07 MED ORDER — BISACODYL 5 MG PO TBEC
20.0000 mg | DELAYED_RELEASE_TABLET | ORAL | 0 refills | Status: AC
  Filled 2024-07-07: qty 4, 1d supply, fill #0

## 2024-07-07 MED ORDER — FLUCONAZOLE 200 MG PO TABS
200.0000 mg | ORAL_TABLET | ORAL | 1 refills | Status: DC
  Filled 2024-07-07: qty 12, 84d supply, fill #0
  Filled 2024-09-23: qty 12, 84d supply, fill #1

## 2024-07-07 MED ORDER — PEG 3350-KCL-NA BICARB-NACL 420 G PO SOLR
ORAL | 0 refills | Status: AC
  Filled 2024-07-07: qty 4000, 1d supply, fill #0

## 2024-07-15 ENCOUNTER — Other Ambulatory Visit (HOSPITAL_COMMUNITY): Payer: Self-pay

## 2024-07-20 DIAGNOSIS — K5730 Diverticulosis of large intestine without perforation or abscess without bleeding: Secondary | ICD-10-CM | POA: Diagnosis not present

## 2024-07-20 DIAGNOSIS — Z09 Encounter for follow-up examination after completed treatment for conditions other than malignant neoplasm: Secondary | ICD-10-CM | POA: Diagnosis not present

## 2024-07-20 DIAGNOSIS — K635 Polyp of colon: Secondary | ICD-10-CM | POA: Diagnosis not present

## 2024-07-20 DIAGNOSIS — Z860101 Personal history of adenomatous and serrated colon polyps: Secondary | ICD-10-CM | POA: Diagnosis not present

## 2024-07-23 ENCOUNTER — Other Ambulatory Visit (HOSPITAL_COMMUNITY): Payer: Self-pay

## 2024-07-23 ENCOUNTER — Other Ambulatory Visit: Payer: Self-pay

## 2024-07-24 ENCOUNTER — Other Ambulatory Visit (HOSPITAL_COMMUNITY): Payer: Self-pay

## 2024-07-24 ENCOUNTER — Other Ambulatory Visit (HOSPITAL_BASED_OUTPATIENT_CLINIC_OR_DEPARTMENT_OTHER): Payer: Self-pay

## 2024-08-18 ENCOUNTER — Other Ambulatory Visit (HOSPITAL_COMMUNITY): Payer: Self-pay

## 2024-08-18 DIAGNOSIS — L730 Acne keloid: Secondary | ICD-10-CM | POA: Diagnosis not present

## 2024-08-18 DIAGNOSIS — L821 Other seborrheic keratosis: Secondary | ICD-10-CM | POA: Diagnosis not present

## 2024-08-18 DIAGNOSIS — L218 Other seborrheic dermatitis: Secondary | ICD-10-CM | POA: Diagnosis not present

## 2024-08-18 MED ORDER — WYNZORA 0.005-0.064 % EX CREA
TOPICAL_CREAM | Freq: Every day | CUTANEOUS | 1 refills | Status: AC
  Filled 2024-08-18: qty 60, 10d supply, fill #0

## 2024-08-20 ENCOUNTER — Other Ambulatory Visit (HOSPITAL_COMMUNITY): Payer: Self-pay

## 2024-08-21 ENCOUNTER — Other Ambulatory Visit (HOSPITAL_COMMUNITY): Payer: Self-pay

## 2024-08-26 ENCOUNTER — Other Ambulatory Visit (HOSPITAL_COMMUNITY): Payer: Self-pay

## 2024-08-28 ENCOUNTER — Other Ambulatory Visit (HOSPITAL_COMMUNITY): Payer: Self-pay

## 2024-09-01 ENCOUNTER — Other Ambulatory Visit (HOSPITAL_COMMUNITY): Payer: Self-pay

## 2024-09-02 ENCOUNTER — Other Ambulatory Visit (HOSPITAL_COMMUNITY): Payer: Self-pay

## 2024-09-03 ENCOUNTER — Other Ambulatory Visit (HOSPITAL_COMMUNITY): Payer: Self-pay

## 2024-09-09 ENCOUNTER — Other Ambulatory Visit (HOSPITAL_COMMUNITY): Payer: Self-pay

## 2024-09-10 ENCOUNTER — Other Ambulatory Visit (HOSPITAL_COMMUNITY): Payer: Self-pay

## 2024-09-16 ENCOUNTER — Other Ambulatory Visit: Payer: Self-pay

## 2024-09-16 ENCOUNTER — Other Ambulatory Visit (HOSPITAL_COMMUNITY): Payer: Self-pay

## 2024-09-16 MED ORDER — PILOCARPINE HCL 1.25 % OP SOLN
OPHTHALMIC | 1 refills | Status: AC
  Filled 2024-09-16 – 2024-09-17 (×2): qty 5, 25d supply, fill #0
  Filled 2024-09-17: qty 5, 30d supply, fill #0
  Filled 2024-09-19: qty 5, 50d supply, fill #0
  Filled 2025-01-12: qty 2.5, 30d supply, fill #0

## 2024-09-17 ENCOUNTER — Other Ambulatory Visit (HOSPITAL_COMMUNITY): Payer: Self-pay

## 2024-09-19 ENCOUNTER — Other Ambulatory Visit (HOSPITAL_COMMUNITY): Payer: Self-pay

## 2024-09-21 DIAGNOSIS — H40023 Open angle with borderline findings, high risk, bilateral: Secondary | ICD-10-CM | POA: Diagnosis not present

## 2024-09-22 ENCOUNTER — Other Ambulatory Visit (HOSPITAL_COMMUNITY): Payer: Self-pay

## 2024-09-30 ENCOUNTER — Other Ambulatory Visit (HOSPITAL_COMMUNITY): Payer: Self-pay

## 2024-10-06 ENCOUNTER — Other Ambulatory Visit (HOSPITAL_COMMUNITY): Payer: Self-pay

## 2024-10-06 DIAGNOSIS — H6123 Impacted cerumen, bilateral: Secondary | ICD-10-CM | POA: Diagnosis not present

## 2024-10-06 DIAGNOSIS — E291 Testicular hypofunction: Secondary | ICD-10-CM | POA: Diagnosis not present

## 2024-10-06 DIAGNOSIS — Z0000 Encounter for general adult medical examination without abnormal findings: Secondary | ICD-10-CM | POA: Diagnosis not present

## 2024-10-06 DIAGNOSIS — E7800 Pure hypercholesterolemia, unspecified: Secondary | ICD-10-CM | POA: Diagnosis not present

## 2024-10-06 DIAGNOSIS — G4733 Obstructive sleep apnea (adult) (pediatric): Secondary | ICD-10-CM | POA: Diagnosis not present

## 2024-10-06 DIAGNOSIS — Z23 Encounter for immunization: Secondary | ICD-10-CM | POA: Diagnosis not present

## 2024-10-06 MED ORDER — ATORVASTATIN CALCIUM 10 MG PO TABS
10.0000 mg | ORAL_TABLET | Freq: Every day | ORAL | 3 refills | Status: AC
  Filled 2024-10-06 – 2024-12-10 (×2): qty 90, 90d supply, fill #0

## 2024-10-19 ENCOUNTER — Other Ambulatory Visit (HOSPITAL_COMMUNITY): Payer: Self-pay

## 2024-10-20 ENCOUNTER — Other Ambulatory Visit (HOSPITAL_COMMUNITY): Payer: Self-pay

## 2024-10-20 DIAGNOSIS — L2089 Other atopic dermatitis: Secondary | ICD-10-CM | POA: Diagnosis not present

## 2024-10-20 DIAGNOSIS — L218 Other seborrheic dermatitis: Secondary | ICD-10-CM | POA: Diagnosis not present

## 2024-10-20 DIAGNOSIS — L730 Acne keloid: Secondary | ICD-10-CM | POA: Diagnosis not present

## 2024-10-20 MED ORDER — TRIAMCINOLONE ACETONIDE 0.025 % EX CREA
TOPICAL_CREAM | Freq: Two times a day (BID) | CUTANEOUS | 0 refills | Status: AC
  Filled 2024-10-20: qty 80, 30d supply, fill #0

## 2024-10-27 ENCOUNTER — Other Ambulatory Visit (HOSPITAL_COMMUNITY): Payer: Self-pay

## 2024-10-31 ENCOUNTER — Other Ambulatory Visit (HOSPITAL_COMMUNITY): Payer: Self-pay

## 2024-11-10 DIAGNOSIS — E291 Testicular hypofunction: Secondary | ICD-10-CM | POA: Diagnosis not present

## 2024-11-18 ENCOUNTER — Other Ambulatory Visit: Payer: Self-pay

## 2024-12-10 ENCOUNTER — Other Ambulatory Visit (HOSPITAL_COMMUNITY): Payer: Self-pay

## 2024-12-11 ENCOUNTER — Other Ambulatory Visit (HOSPITAL_COMMUNITY): Payer: Self-pay

## 2024-12-15 ENCOUNTER — Other Ambulatory Visit (HOSPITAL_COMMUNITY): Payer: Self-pay

## 2024-12-15 MED ORDER — FLUCONAZOLE 200 MG PO TABS
200.0000 mg | ORAL_TABLET | ORAL | 1 refills | Status: AC
  Filled 2024-12-15 – 2024-12-29 (×2): qty 12, 84d supply, fill #0

## 2024-12-28 ENCOUNTER — Other Ambulatory Visit (HOSPITAL_COMMUNITY): Payer: Self-pay

## 2024-12-29 ENCOUNTER — Other Ambulatory Visit (HOSPITAL_COMMUNITY): Payer: Self-pay

## 2025-01-12 ENCOUNTER — Other Ambulatory Visit (HOSPITAL_COMMUNITY): Payer: Self-pay

## 2025-01-19 ENCOUNTER — Other Ambulatory Visit (HOSPITAL_COMMUNITY): Payer: Self-pay

## 2025-01-23 ENCOUNTER — Other Ambulatory Visit (HOSPITAL_COMMUNITY): Payer: Self-pay

## 2025-01-23 MED ORDER — TESTOSTERONE CYPIONATE 200 MG/ML IM SOLN
150.0000 mg | INTRAMUSCULAR | 1 refills | Status: AC
  Filled 2025-01-27: qty 10, 70d supply, fill #0
  Filled 2025-01-27: qty 10, 90d supply, fill #0

## 2025-01-27 ENCOUNTER — Other Ambulatory Visit (HOSPITAL_COMMUNITY): Payer: Self-pay
